# Patient Record
Sex: Female | Born: 1985 | Race: Black or African American | Hispanic: No | Marital: Married | Smoking: Never smoker
Health system: Southern US, Community
[De-identification: ages and names within clinical notes are randomized; demographics above are authoritative.]

## PROBLEM LIST (undated history)

## (undated) ENCOUNTER — Inpatient Hospital Stay (HOSPITAL_COMMUNITY): Payer: Self-pay

## (undated) DIAGNOSIS — T4145XA Adverse effect of unspecified anesthetic, initial encounter: Secondary | ICD-10-CM

## (undated) DIAGNOSIS — T8859XA Other complications of anesthesia, initial encounter: Secondary | ICD-10-CM

---

## 2016-02-04 LAB — OB RESULTS CONSOLE GC/CHLAMYDIA
CHLAMYDIA, DNA PROBE: NEGATIVE
Gonorrhea: NEGATIVE

## 2016-02-04 LAB — OB RESULTS CONSOLE HIV ANTIBODY (ROUTINE TESTING): HIV: NONREACTIVE

## 2016-02-04 LAB — OB RESULTS CONSOLE RPR: RPR: NONREACTIVE

## 2016-02-04 LAB — OB RESULTS CONSOLE ABO/RH: RH TYPE: POSITIVE

## 2016-02-04 LAB — OB RESULTS CONSOLE ANTIBODY SCREEN: ANTIBODY SCREEN: NEGATIVE

## 2016-02-04 LAB — OB RESULTS CONSOLE HEPATITIS B SURFACE ANTIGEN: HEP B S AG: NEGATIVE

## 2016-02-04 LAB — OB RESULTS CONSOLE RUBELLA ANTIBODY, IGM: RUBELLA: IMMUNE

## 2016-03-21 ENCOUNTER — Encounter (HOSPITAL_COMMUNITY): Payer: Self-pay | Admitting: *Deleted

## 2016-03-21 ENCOUNTER — Inpatient Hospital Stay (HOSPITAL_COMMUNITY)
Admission: AD | Admit: 2016-03-21 | Discharge: 2016-03-22 | Disposition: A | Discharge status: Home / Self Care | Payer: Medicaid Other | Source: Ambulatory Visit | Attending: Obstetrics & Gynecology | Admitting: Obstetrics & Gynecology

## 2016-03-21 DIAGNOSIS — Z3A18 18 weeks gestation of pregnancy: Secondary | ICD-10-CM | POA: Diagnosis not present

## 2016-03-21 DIAGNOSIS — O26892 Other specified pregnancy related conditions, second trimester: Secondary | ICD-10-CM | POA: Diagnosis not present

## 2016-03-21 DIAGNOSIS — N949 Unspecified condition associated with female genital organs and menstrual cycle: Secondary | ICD-10-CM | POA: Diagnosis not present

## 2016-03-21 DIAGNOSIS — N898 Other specified noninflammatory disorders of vagina: Secondary | ICD-10-CM | POA: Insufficient documentation

## 2016-03-21 DIAGNOSIS — R102 Pelvic and perineal pain: Secondary | ICD-10-CM | POA: Insufficient documentation

## 2016-03-21 LAB — URINALYSIS, ROUTINE W REFLEX MICROSCOPIC
Bilirubin Urine: NEGATIVE
GLUCOSE, UA: NEGATIVE mg/dL
Hgb urine dipstick: NEGATIVE
KETONES UR: NEGATIVE mg/dL
LEUKOCYTES UA: NEGATIVE
Nitrite: NEGATIVE
PROTEIN: NEGATIVE mg/dL
Specific Gravity, Urine: 1.02 (ref 1.005–1.030)
pH: 6.5 (ref 5.0–8.0)

## 2016-03-21 LAB — WET PREP, GENITAL
CLUE CELLS WET PREP: NONE SEEN
Sperm: NONE SEEN
Trich, Wet Prep: NONE SEEN
YEAST WET PREP: NONE SEEN

## 2016-03-21 MED ORDER — CYCLOBENZAPRINE HCL 10 MG PO TABS
10.0000 mg | ORAL_TABLET | Freq: Once | ORAL | Status: AC
  Administered 2016-03-21: 10 mg via ORAL
  Filled 2016-03-21: qty 1

## 2016-03-21 MED ORDER — CYCLOBENZAPRINE HCL 10 MG PO TABS: 10.0000 mg | ORAL_TABLET | Freq: Two times a day (BID) | ORAL | Status: DC | PRN

## 2016-03-21 NOTE — Discharge Instructions (Signed)

## 2016-03-21 NOTE — MAU Provider Note (Signed)
History     CSN: [id number]  Arrival date and time: 03/21/16 2213   First Provider Initiated Contact with Patient 03/21/16 2256      Chief Complaint  Patient presents with  . Abdominal Pain   HPI Ms. Victoria Keith is a 30 y.o. G3P1010 at [redacted]w[redacted]d who presents to MAU today with complaint of LLQ abdominal pain since this morning. She has tried 1 regular Tylenol twice today without relief. She denies vaginal bleeding, but has noted a small amount of thin, white discharge. She rates her pain at 10/10 now. Pain is worse with walking and change or positions. She denies dysuria, frequency or urgency, but does note pain is somewhat worse after urination.   OB History    Gravida Para Term Preterm AB TAB SAB Ectopic Multiple Living   History reviewed. No pertinent past medical history.  History reviewed. No pertinent past surgical history.  History reviewed. No pertinent family history.  Social History  Substance Use Topics  . Smoking status: Never Smoker   . Smokeless tobacco: None  . Alcohol Use: No    Allergies: No Known Allergies  No prescriptions prior to admission    Review of Systems  Constitutional: Negative for fever and malaise/fatigue.  Gastrointestinal: Positive for abdominal pain. Negative for nausea, vomiting, diarrhea and constipation.  Genitourinary: Negative for dysuria, urgency and frequency.       Neg - vaginal bleeding + vaginal discharge   Physical Exam   Blood pressure 98/63, pulse 67, temperature 98 F (36.7 C), temperature source Oral, resp. rate 18, height  (1.651 m), weight 154 lb 8 oz (70.081 kg), last menstrual period 11/10/2015.  Physical Exam  Nursing note and vitals reviewed. Constitutional: She is oriented to person, place, and time. She appears well-developed and well-nourished. No distress.  HENT:  Head: Normocephalic and atraumatic.  Cardiovascular: Normal rate.   Respiratory: Effort normal.  GI: Soft.  Bowel sounds are normal. She exhibits no distension and no mass. There is tenderness (moderate tenderness to palpation of the LLQ). There is no rebound and no guarding.  Genitourinary: Uterus is enlarged. Uterus is not tender. Cervix exhibits no motion tenderness, no discharge and no friability. No bleeding in the vagina. Vaginal discharge (scant white discharge noted) found.  Neurological: She is alert and oriented to person, place, and time.  Skin: Skin is warm and dry. No erythema.  Psychiatric: She has a normal mood and affect.  Dilation: Closed Effacement (%): Thick Cervical Position: Posterior Exam by:: Magnus Sinning, PA-C   Results for orders placed or performed during the hospital encounter of 03/21/16 (from the past 24 hour(s))  Urinalysis, Routine w reflex microscopic (not at [facility])     Status: None   Collection Time: 03/21/16 10:30 PM  Result Value Ref Range   Color, Urine YELLOW YELLOW   APPearance CLEAR CLEAR   Specific Gravity, Urine 1.020 1.005 - 1.030   pH 6.5 5.0 - 8.0   Glucose, UA NEGATIVE NEGATIVE mg/dL   Hgb urine dipstick NEGATIVE NEGATIVE   Bilirubin Urine NEGATIVE NEGATIVE   Ketones, ur NEGATIVE NEGATIVE mg/dL   Protein, ur NEGATIVE NEGATIVE mg/dL   Nitrite NEGATIVE NEGATIVE   Leukocytes, UA NEGATIVE NEGATIVE  Wet prep, genital     Status: Abnormal   Collection Time: 03/21/16 11:07 PM  Result Value Ref Range   Yeast Wet Prep HPF POC NONE SEEN NONE SEEN  Trich, Wet Prep NONE SEEN NONE SEEN   Clue Cells Wet Prep HPF POC NONE SEEN NONE SEEN   WBC, Wet Prep HPF POC FEW (A) NONE SEEN   Sperm NONE SEEN     MAU Course  Procedures None  MDM FHR - 138 bpm with doppler UA and wet prep today 10 mg Flexeril given - patient reports no pain on recheck after ~ 20 minutes Assessment and Plan  A: SIUP at 1045w6d Round ligament pain  P: Discharge home Rx for Flexeril given to patient  Second trimester precautions discussed Discussed appropriate use of Tylenol PRN for  pain Discussed use of abdominal binder, warm bath/shower and moderation of activity for pain Patient advised to follow-up with [facility] as scheduled for routine prenatal care or sooner PRN Patient may return to MAU as needed or if her condition were to change or worsen   Marny LowensteinJulie N Antrell Tipler, PA-C  03/22/2016, 12:50 AM

## 2016-03-21 NOTE — MAU Note (Signed)
Patient presents at 2018 weeks gestation with c/o lower left abdominal pain since this morning. Fetus active this morning but not tonight. Denies bleeding but notes discharge.

## 2016-03-31 ENCOUNTER — Other Ambulatory Visit: Payer: Self-pay | Admitting: Obstetrics & Gynecology

## 2016-03-31 DIAGNOSIS — O351XX0 Maternal care for (suspected) chromosomal abnormality in fetus, not applicable or unspecified: Secondary | ICD-10-CM

## 2016-03-31 DIAGNOSIS — Z3689 Encounter for other specified antenatal screening: Secondary | ICD-10-CM

## 2016-03-31 DIAGNOSIS — Z3A22 22 weeks gestation of pregnancy: Secondary | ICD-10-CM

## 2016-03-31 DIAGNOSIS — O3513X0 Maternal care for (suspected) chromosomal abnormality in fetus, trisomy 21, not applicable or unspecified: Secondary | ICD-10-CM

## 2016-04-04 ENCOUNTER — Encounter (HOSPITAL_COMMUNITY): Payer: Self-pay | Admitting: Obstetrics & Gynecology

## 2016-04-11 ENCOUNTER — Ambulatory Visit (HOSPITAL_COMMUNITY)
Admission: RE | Admit: 2016-04-11 | Discharge: 2016-04-11 | Disposition: A | Discharge status: Home / Self Care | Payer: Medicaid Other | Source: Ambulatory Visit | Attending: Obstetrics & Gynecology | Admitting: Obstetrics & Gynecology

## 2016-04-11 ENCOUNTER — Other Ambulatory Visit: Payer: Self-pay | Admitting: Obstetrics & Gynecology

## 2016-04-11 ENCOUNTER — Encounter (HOSPITAL_COMMUNITY): Payer: Self-pay

## 2016-04-11 DIAGNOSIS — Z36 Encounter for antenatal screening of mother: Secondary | ICD-10-CM | POA: Insufficient documentation

## 2016-04-11 DIAGNOSIS — Z3A21 21 weeks gestation of pregnancy: Secondary | ICD-10-CM | POA: Diagnosis present

## 2016-04-11 DIAGNOSIS — O280 Abnormal hematological finding on antenatal screening of mother: Secondary | ICD-10-CM

## 2016-04-11 DIAGNOSIS — O3513X0 Maternal care for (suspected) chromosomal abnormality in fetus, trisomy 21, not applicable or unspecified: Secondary | ICD-10-CM

## 2016-04-11 DIAGNOSIS — O281 Abnormal biochemical finding on antenatal screening of mother: Secondary | ICD-10-CM

## 2016-04-11 DIAGNOSIS — O351XX0 Maternal care for (suspected) chromosomal abnormality in fetus, not applicable or unspecified: Secondary | ICD-10-CM

## 2016-04-11 DIAGNOSIS — Z3A22 22 weeks gestation of pregnancy: Secondary | ICD-10-CM

## 2016-04-11 DIAGNOSIS — O34219 Maternal care for unspecified type scar from previous cesarean delivery: Secondary | ICD-10-CM | POA: Insufficient documentation

## 2016-04-11 DIAGNOSIS — Z3689 Encounter for other specified antenatal screening: Secondary | ICD-10-CM

## 2016-04-11 NOTE — Progress Notes (Signed)
Genetic Counseling  High-Risk Gestation Note  Appointment Date:  04/11/2016 Referred By: Victoria Keith, Victoria A, MD Date of Birth:  January 29, 1986 Partner:  Victoria Keith   Pregnancy History: [id number]: G3P1011 Estimated Date of Delivery: 08/16/16 Estimated Gestational Age: 2847w6d Attending: Particia NearingMartha Decker, MD   Ms. Victoria Keith and her husband, Mr. Victoria Keith, were seen for genetic counseling because of an increased risk for fetal Down syndrome based on Quad screen through [facility].  In summary:  Reviewed results of Quad screening test  Increased risk for Down syndrome (1 in 269)  Elevated DIA (2.88 MoM): offer third trimester ultrasound to assess fetal growth  Discussed additional screening options  NIPS-declined today; considering but wanted more time to think over information  Ultrasound: performed today and within normal limits  Discussed diagnostic testing options  Amniocentesis: declined  Reviewed family history concerns  Discussed general population carrier screening options: patient declined (previous CF and Hemoglobinopathy screening [facility])  They were counseled regarding the screening result and the associated 1 in 269 risk for fetal Down syndrome.  We reviewed chromosomes, nondisjunction, and the common features and variable prognosis of Down syndrome.  In addition, we reviewed the screen adjusted reduction in risks for trisomy 18 (1 in >10,000) and open neural tube defects.  We also discussed other explanations for a screen positive result including: a gestational dating error, differences in maternal metabolism, and normal variation. We specifically discussed that the level of one of the proteins analyzed on the screen, DIA, was very high (2.88 MoM).  This has been associated with an increased risk for growth restriction or poor pregnancy outcome later in pregnancy; therefore, we would recommend a follow up ultrasound  for fetal growth in the third trimester.  We reviewed other available screening options including noninvasive prenatal screening (NIPS)/cell free DNA (cfDNA) screening, and detailed ultrasound.  They were counseled that screening tests are used to modify a patient's a priori risk for aneuploidy, typically based on age. This estimate provides a pregnancy specific risk assessment. We reviewed the benefits and limitations of each option. Specifically, we discussed the conditions for which each test screens, the detection rates, and false positive rates of each. They were also counseled regarding diagnostic testing via amniocentesis. We reviewed the approximate 1 in 300-500 risk for complications from amniocentesis, including spontaneous pregnancy loss. We discussed the possible results that the tests might provide including: positive, negative, unanticipated, and no result. Finally, they were counseled regarding the cost of each option and potential out of pocket expenses.   Detailed ultrasound was performed today. Visualized fetal anatomy was within normal limits. Complete ultrasound results reported under separate cover. We reviewed the benefits and limitations of ultrasound as a screening tool for fetal aneuploidy. After consideration of all the options, they declined amniocentesis given the associated risk for complications. They also declined NIPS today. Ms. Victoria Keith indicated that she may consider pursuing NIPS but wanted additional time to process the Quad screen results and further consider NIPS as a screening option.   They understand that screening tests cannot rule out all birth defects or genetic syndromes. The patient was advised of this limitation and states she still does not want additional testing at this time.   Ms. Victoria Keith was provided with written information regarding cystic fibrosis (CF), spinal muscular atrophy (SMA) and hemoglobinopathies including the carrier frequency,  availability of carrier screening and prenatal diagnosis if indicated.  In addition, we discussed that CF  and hemoglobinopathies are routinely screened for as part of the [place] newborn screening panel.  After further discussion, she declined screening for CF, SMA and hemoglobinopathies.   Both family histories were reviewed and found to be contributory for a distant maternal cousin with deafness and another distant maternal cousin with blindness, both related through the patient's maternal grandfather and both cousins to each other. She had limited information regarding these relatives but reported that each was otherwise healthy. We discussed that there can be various causes for congenital deafness and congenital blindness including teratogenic, multifactorial, sporadic, and genetic. Given the reported family history and degree of relation, recurrence risk for the current pregnancy is likely low. However, without further information regarding the provided family history, an accurate genetic risk cannot be calculated. Further genetic counseling is warranted if more information is obtained.  Ms. Victoria Keith denied exposure to environmental toxins or chemical agents. She denied the use of alcohol, tobacco or street drugs. She denied significant viral illnesses during the course of her pregnancy. Her medical and surgical histories were noncontributory.   I counseled this couple for approximately 40 minutes regarding the above risks and available options.   Victoria PlowmanKaren Shuntia Exton, MS,  Certified [employer] 04/11/2016

## 2016-04-21 ENCOUNTER — Other Ambulatory Visit: Payer: Self-pay | Admitting: Obstetrics and Gynecology

## 2016-04-21 ENCOUNTER — Other Ambulatory Visit (HOSPITAL_COMMUNITY): Payer: Self-pay | Admitting: Physician Assistant

## 2016-04-21 DIAGNOSIS — O289 Unspecified abnormal findings on antenatal screening of mother: Secondary | ICD-10-CM

## 2016-04-21 DIAGNOSIS — O34219 Maternal care for unspecified type scar from previous cesarean delivery: Secondary | ICD-10-CM

## 2016-04-21 DIAGNOSIS — Z3A29 29 weeks gestation of pregnancy: Secondary | ICD-10-CM

## 2016-04-27 ENCOUNTER — Other Ambulatory Visit (HOSPITAL_COMMUNITY): Payer: Self-pay

## 2016-04-29 ENCOUNTER — Encounter (HOSPITAL_COMMUNITY): Payer: Self-pay

## 2016-05-31 ENCOUNTER — Encounter (HOSPITAL_COMMUNITY): Payer: Self-pay

## 2016-05-31 ENCOUNTER — Other Ambulatory Visit (HOSPITAL_COMMUNITY): Payer: Self-pay | Admitting: Physician Assistant

## 2016-05-31 ENCOUNTER — Ambulatory Visit (HOSPITAL_COMMUNITY)
Admission: RE | Admit: 2016-05-31 | Discharge: 2016-05-31 | Disposition: A | Discharge status: Home / Self Care | Payer: Medicaid Other | Source: Ambulatory Visit | Attending: Obstetrics & Gynecology | Admitting: Obstetrics & Gynecology

## 2016-05-31 VITALS — BP 109/66 | HR 98 | Wt 173.2 lb

## 2016-05-31 DIAGNOSIS — Z3A29 29 weeks gestation of pregnancy: Secondary | ICD-10-CM

## 2016-05-31 DIAGNOSIS — O34219 Maternal care for unspecified type scar from previous cesarean delivery: Secondary | ICD-10-CM | POA: Diagnosis not present

## 2016-05-31 DIAGNOSIS — O281 Abnormal biochemical finding on antenatal screening of mother: Secondary | ICD-10-CM | POA: Diagnosis not present

## 2016-05-31 DIAGNOSIS — O280 Abnormal hematological finding on antenatal screening of mother: Secondary | ICD-10-CM

## 2016-05-31 DIAGNOSIS — O4400 Placenta previa specified as without hemorrhage, unspecified trimester: Secondary | ICD-10-CM

## 2016-05-31 DIAGNOSIS — O289 Unspecified abnormal findings on antenatal screening of mother: Secondary | ICD-10-CM

## 2016-07-19 ENCOUNTER — Encounter (HOSPITAL_COMMUNITY): Payer: Self-pay

## 2016-07-19 ENCOUNTER — Other Ambulatory Visit (HOSPITAL_COMMUNITY): Payer: Self-pay | Admitting: Maternal and Fetal Medicine

## 2016-07-19 ENCOUNTER — Ambulatory Visit (HOSPITAL_COMMUNITY)
Admission: RE | Admit: 2016-07-19 | Discharge: 2016-07-19 | Disposition: A | Discharge status: Home / Self Care | Payer: Medicaid Other | Source: Ambulatory Visit | Attending: Obstetrics & Gynecology | Admitting: Obstetrics & Gynecology

## 2016-07-19 DIAGNOSIS — Z362 Encounter for other antenatal screening follow-up: Secondary | ICD-10-CM

## 2016-07-19 DIAGNOSIS — Z3A36 36 weeks gestation of pregnancy: Secondary | ICD-10-CM

## 2016-07-19 DIAGNOSIS — O281 Abnormal biochemical finding on antenatal screening of mother: Secondary | ICD-10-CM

## 2016-07-19 DIAGNOSIS — O34219 Maternal care for unspecified type scar from previous cesarean delivery: Secondary | ICD-10-CM | POA: Insufficient documentation

## 2016-07-19 DIAGNOSIS — O4403 Placenta previa specified as without hemorrhage, third trimester: Secondary | ICD-10-CM | POA: Insufficient documentation

## 2016-07-19 DIAGNOSIS — O4400 Placenta previa specified as without hemorrhage, unspecified trimester: Secondary | ICD-10-CM

## 2016-07-21 ENCOUNTER — Inpatient Hospital Stay (HOSPITAL_COMMUNITY)
Admission: AD | Admit: 2016-07-21 | Discharge: 2016-07-21 | Discharge status: Against Medical Advice | Payer: Medicaid Other | Source: Ambulatory Visit | Attending: Family Medicine | Admitting: Family Medicine

## 2016-07-21 NOTE — MAU Note (Signed)
Pt states she has to leave, AMA form signed.

## 2016-08-01 ENCOUNTER — Encounter (HOSPITAL_COMMUNITY): Payer: Self-pay

## 2016-08-01 LAB — OB RESULTS CONSOLE GBS: GBS: NEGATIVE

## 2016-08-04 ENCOUNTER — Ambulatory Visit (INDEPENDENT_AMBULATORY_CARE_PROVIDER_SITE_OTHER): Payer: Self-pay | Admitting: Pediatrics

## 2016-08-04 DIAGNOSIS — Z3490 Encounter for supervision of normal pregnancy, unspecified, unspecified trimester: Secondary | ICD-10-CM

## 2016-08-04 DIAGNOSIS — Z7681 Expectant parent(s) prebirth pediatrician visit: Secondary | ICD-10-CM

## 2016-08-04 NOTE — Progress Notes (Signed)
Prenatal counseling for impending newborn done  

## 2016-08-05 ENCOUNTER — Telehealth: Payer: Self-pay | Admitting: Obstetrics and Gynecology

## 2016-08-08 ENCOUNTER — Encounter (HOSPITAL_COMMUNITY)
Admission: RE | Admit: 2016-08-08 | Discharge: 2016-08-08 | Disposition: A | Discharge status: Home / Self Care | Payer: Medicaid Other | Source: Ambulatory Visit | Attending: Obstetrics and Gynecology | Admitting: Obstetrics and Gynecology

## 2016-08-08 ENCOUNTER — Telehealth: Payer: Self-pay | Admitting: Obstetrics and Gynecology

## 2016-08-08 HISTORY — DX: Adverse effect of unspecified anesthetic, initial encounter: T41.45XA

## 2016-08-08 HISTORY — DX: Other complications of anesthesia, initial encounter: T88.59XA

## 2016-08-08 LAB — CBC
HEMATOCRIT: 35.1 % — AB (ref 36.0–46.0)
HEMOGLOBIN: 12 g/dL (ref 12.0–15.0)
MCH: 30.2 pg (ref 26.0–34.0)
MCHC: 34.2 g/dL (ref 30.0–36.0)
MCV: 88.2 fL (ref 78.0–100.0)
Platelets: 159 10*3/uL (ref 150–400)
RBC: 3.98 MIL/uL (ref 3.87–5.11)
RDW: 15.3 % (ref 11.5–15.5)
WBC: 8.1 10*3/uL (ref 4.0–10.5)

## 2016-08-08 LAB — TYPE AND SCREEN
ABO/RH(D): B POS
ANTIBODY SCREEN: NEGATIVE

## 2016-08-08 LAB — ABO/RH: ABO/RH(D): B POS

## 2016-08-08 NOTE — Patient Instructions (Signed)
20 Victoria CaddyRachel Keith  08/08/2016   Your procedure is scheduled on:  08/09/2016  Enter through the Main Entr[facility] of [facility] at 0800 AM.  Pick up the phone at the desk and dial [phone].   Call this number if you have problems the morning of surgery: [phone]   Remember:   Do not eat food:After Midnight.  Do not drink clear liquids: After Midnight.  Take these medicines the morning of surgery with A SIP OF WATER: none   Do not wear jewelry, make-up or nail polish.  Do not wear lotions, powders, or perfumes. Do not wear deodorant.  Do not shave 48 hours prior to surgery.  Do not bring valuables to the hospital.  [facility] is not   responsible for any belongings or valuables brought to the hospital.  Contacts, dentures or bridgework may not be worn into surgery.  Leave suitcase in the car. After surgery it may be brought to your room.  For patients admitted to the hospital, checkout time is 11:00 AM the day of              discharge.   Patients discharged the day of surgery will not be allowed to drive             home.  Name and phone number of your driver: na  Special Instructions:   N/A   Please read over the following fact sheets that you were given:   Surgical Site Infection Prevention

## 2016-08-08 NOTE — Telephone Encounter (Signed)
OB Telephone Note Patient called bc of staff message sent on 10/20 saying pt would like to talk before surgery. She just wanted to make sure it was known that she had a seizure after a double dose of anesthesia prior to her c-section [name]. She states she had regional with that. I told her that the anesthesia doctors and CRNAs involved in her care tomorrow will meet her and go over her history again and we will only proceed once everything is safe and I thanked her for making sure we knew.  CBC and ABO normal.   [place] Victoria Keith, [name] MD Attending Center for [facility](Faculty Practice)

## 2016-08-09 ENCOUNTER — Inpatient Hospital Stay (HOSPITAL_COMMUNITY): Payer: Medicaid Other | Admitting: Certified Registered Nurse Anesthetist

## 2016-08-09 ENCOUNTER — Encounter: Payer: Self-pay | Admitting: Obstetrics and Gynecology

## 2016-08-09 ENCOUNTER — Inpatient Hospital Stay (HOSPITAL_COMMUNITY)
Admission: RE | Admit: 2016-08-09 | Discharge: 2016-08-11 | DRG: 766 | Disposition: A | Discharge status: Home / Self Care | Payer: Medicaid Other | Source: Ambulatory Visit | Attending: Obstetrics and Gynecology | Admitting: Obstetrics and Gynecology

## 2016-08-09 ENCOUNTER — Encounter (HOSPITAL_COMMUNITY)
Admission: RE | Disposition: A | Discharge status: Home / Self Care | Payer: Self-pay | Source: Ambulatory Visit | Attending: Obstetrics and Gynecology

## 2016-08-09 DIAGNOSIS — Z3A39 39 weeks gestation of pregnancy: Secondary | ICD-10-CM | POA: Diagnosis not present

## 2016-08-09 DIAGNOSIS — Z98891 History of uterine scar from previous surgery: Secondary | ICD-10-CM | POA: Insufficient documentation

## 2016-08-09 DIAGNOSIS — O34211 Maternal care for low transverse scar from previous cesarean delivery: Principal | ICD-10-CM | POA: Diagnosis present

## 2016-08-09 LAB — RPR: RPR Ser Ql: NONREACTIVE

## 2016-08-09 SURGERY — Surgical Case
Anesthesia: Spinal | Site: Abdomen

## 2016-08-09 MED ORDER — ACETAMINOPHEN 325 MG PO TABS
650.0000 mg | ORAL_TABLET | ORAL | Status: DC | PRN
  Administered 2016-08-10 – 2016-08-11 (×5): 650 mg via ORAL
  Filled 2016-08-09 (×5): qty 2

## 2016-08-09 MED ORDER — NALOXONE HCL 0.4 MG/ML IJ SOLN: 0.4000 mg | INTRAMUSCULAR | Status: DC | PRN

## 2016-08-09 MED ORDER — ZOLPIDEM TARTRATE 5 MG PO TABS: 5.0000 mg | ORAL_TABLET | Freq: Every evening | ORAL | Status: DC | PRN

## 2016-08-09 MED ORDER — OXYTOCIN 40 UNITS IN LACTATED RINGERS INFUSION - SIMPLE MED
2.5000 [IU]/h | INTRAVENOUS | Status: AC
  Administered 2016-08-09: 2.5 [IU]/h via INTRAVENOUS

## 2016-08-09 MED ORDER — HYDROMORPHONE HCL 1 MG/ML IJ SOLN
INTRAMUSCULAR | Status: AC
  Filled 2016-08-09: qty 1

## 2016-08-09 MED ORDER — LACTATED RINGERS IV SOLN
INTRAVENOUS | Status: DC
  Administered 2016-08-09 (×3): via INTRAVENOUS

## 2016-08-09 MED ORDER — OXYCODONE HCL 5 MG PO TABS
10.0000 mg | ORAL_TABLET | ORAL | Status: DC | PRN
  Administered 2016-08-10 (×3): 10 mg via ORAL
  Filled 2016-08-09 (×3): qty 2

## 2016-08-09 MED ORDER — MENTHOL 3 MG MT LOZG: 1.0000 | LOZENGE | OROMUCOSAL | Status: DC | PRN

## 2016-08-09 MED ORDER — SIMETHICONE 80 MG PO CHEW
80.0000 mg | CHEWABLE_TABLET | Freq: Three times a day (TID) | ORAL | Status: DC
  Administered 2016-08-09 – 2016-08-11 (×6): 80 mg via ORAL
  Filled 2016-08-09 (×6): qty 1

## 2016-08-09 MED ORDER — FENTANYL CITRATE (PF) 100 MCG/2ML IJ SOLN
INTRAMUSCULAR | Status: DC | PRN
Start: 2016-08-09 — End: 2016-08-09
  Administered 2016-08-09: 20 ug via INTRATHECAL

## 2016-08-09 MED ORDER — OXYTOCIN 10 UNIT/ML IJ SOLN
INTRAMUSCULAR | Status: AC
  Filled 2016-08-09: qty 4

## 2016-08-09 MED ORDER — NALBUPHINE HCL 10 MG/ML IJ SOLN: 5.0000 mg | Freq: Once | INTRAMUSCULAR | Status: AC | PRN

## 2016-08-09 MED ORDER — ONDANSETRON HCL 4 MG/2ML IJ SOLN
INTRAMUSCULAR | Status: DC | PRN
Start: 2016-08-09 — End: 2016-08-09
  Administered 2016-08-09: 4 mg via INTRAVENOUS

## 2016-08-09 MED ORDER — LACTATED RINGERS IV SOLN: INTRAVENOUS | Status: DC

## 2016-08-09 MED ORDER — DIBUCAINE 1 % RE OINT: 1.0000 "application " | TOPICAL_OINTMENT | RECTAL | Status: DC | PRN

## 2016-08-09 MED ORDER — MEPERIDINE HCL 25 MG/ML IJ SOLN: 6.2500 mg | INTRAMUSCULAR | Status: DC | PRN

## 2016-08-09 MED ORDER — NALBUPHINE HCL 10 MG/ML IJ SOLN
INTRAMUSCULAR | Status: AC
  Filled 2016-08-09: qty 1

## 2016-08-09 MED ORDER — PROMETHAZINE HCL 25 MG/ML IJ SOLN: 6.2500 mg | INTRAMUSCULAR | Status: DC | PRN

## 2016-08-09 MED ORDER — OXYTOCIN 10 UNIT/ML IJ SOLN
INTRAMUSCULAR | Status: DC | PRN
  Administered 2016-08-09: 40 [IU] via INTRAVENOUS

## 2016-08-09 MED ORDER — MEPERIDINE HCL 25 MG/ML IJ SOLN
6.2500 mg | INTRAMUSCULAR | Status: DC | PRN
  Administered 2016-08-09 (×2): 12.5 mg via INTRAVENOUS

## 2016-08-09 MED ORDER — SENNOSIDES-DOCUSATE SODIUM 8.6-50 MG PO TABS
2.0000 | ORAL_TABLET | ORAL | Status: DC
  Administered 2016-08-10 (×2): 2 via ORAL
  Filled 2016-08-09 (×4): qty 2

## 2016-08-09 MED ORDER — OXYCODONE HCL 5 MG PO TABS
5.0000 mg | ORAL_TABLET | ORAL | Status: DC | PRN
  Administered 2016-08-10 – 2016-08-11 (×4): 5 mg via ORAL
  Filled 2016-08-09 (×4): qty 1

## 2016-08-09 MED ORDER — SODIUM CHLORIDE 0.9 % IR SOLN
Status: DC | PRN
  Administered 2016-08-09: 1000 mL

## 2016-08-09 MED ORDER — DIPHENHYDRAMINE HCL 50 MG/ML IJ SOLN: 12.5000 mg | INTRAMUSCULAR | Status: DC | PRN

## 2016-08-09 MED ORDER — NALOXONE HCL 2 MG/2ML IJ SOSY
1.0000 ug/kg/h | PREFILLED_SYRINGE | INTRAMUSCULAR | Status: DC | PRN
  Filled 2016-08-09: qty 2

## 2016-08-09 MED ORDER — COCONUT OIL OIL
1.0000 "application " | TOPICAL_OIL | Status: DC | PRN
  Filled 2016-08-09: qty 120

## 2016-08-09 MED ORDER — NALBUPHINE HCL 10 MG/ML IJ SOLN: 5.0000 mg | INTRAMUSCULAR | Status: DC | PRN

## 2016-08-09 MED ORDER — SIMETHICONE 80 MG PO CHEW
80.0000 mg | CHEWABLE_TABLET | ORAL | Status: DC
  Administered 2016-08-10 (×2): 80 mg via ORAL
  Filled 2016-08-09 (×2): qty 1

## 2016-08-09 MED ORDER — SCOPOLAMINE 1 MG/3DAYS TD PT72
MEDICATED_PATCH | TRANSDERMAL | Status: AC
  Administered 2016-08-09: 1.5 mg via TRANSDERMAL
  Filled 2016-08-09: qty 1

## 2016-08-09 MED ORDER — SIMETHICONE 80 MG PO CHEW
80.0000 mg | CHEWABLE_TABLET | ORAL | Status: DC | PRN
Start: 2016-08-09 — End: 2016-08-11

## 2016-08-09 MED ORDER — DIPHENHYDRAMINE HCL 25 MG PO CAPS
25.0000 mg | ORAL_CAPSULE | ORAL | Status: DC | PRN
  Filled 2016-08-09: qty 1

## 2016-08-09 MED ORDER — PANTOPRAZOLE SODIUM 40 MG PO TBEC
40.0000 mg | DELAYED_RELEASE_TABLET | Freq: Every day | ORAL | Status: DC
  Administered 2016-08-10 – 2016-08-11 (×2): 40 mg via ORAL
  Filled 2016-08-09 (×2): qty 1

## 2016-08-09 MED ORDER — PHENYLEPHRINE 8 MG IN D5W 100 ML (0.08MG/ML) PREMIX OPTIME
INJECTION | INTRAVENOUS | Status: DC | PRN
  Administered 2016-08-09: 60 ug/min via INTRAVENOUS

## 2016-08-09 MED ORDER — WITCH HAZEL-GLYCERIN EX PADS: 1.0000 "application " | MEDICATED_PAD | CUTANEOUS | Status: DC | PRN

## 2016-08-09 MED ORDER — PHENYLEPHRINE 8 MG IN D5W 100 ML (0.08MG/ML) PREMIX OPTIME
INJECTION | INTRAVENOUS | Status: AC
  Filled 2016-08-09: qty 100

## 2016-08-09 MED ORDER — SODIUM CHLORIDE 0.9% FLUSH: 3.0000 mL | INTRAVENOUS | Status: DC | PRN

## 2016-08-09 MED ORDER — NALBUPHINE HCL 10 MG/ML IJ SOLN
5.0000 mg | Freq: Once | INTRAMUSCULAR | Status: AC | PRN
  Administered 2016-08-09: 5 mg via SUBCUTANEOUS

## 2016-08-09 MED ORDER — LACTATED RINGERS IV SOLN
INTRAVENOUS | Status: DC
  Administered 2016-08-09: 10:00:00 via INTRAVENOUS

## 2016-08-09 MED ORDER — DIPHENHYDRAMINE HCL 25 MG PO CAPS
25.0000 mg | ORAL_CAPSULE | Freq: Four times a day (QID) | ORAL | Status: DC | PRN
Start: 2016-08-09 — End: 2016-08-11
  Filled 2016-08-09: qty 1

## 2016-08-09 MED ORDER — SCOPOLAMINE 1 MG/3DAYS TD PT72: 1.0000 | MEDICATED_PATCH | Freq: Once | TRANSDERMAL | Status: DC

## 2016-08-09 MED ORDER — ONDANSETRON HCL 4 MG/2ML IJ SOLN
INTRAMUSCULAR | Status: AC
  Filled 2016-08-09: qty 2

## 2016-08-09 MED ORDER — FENTANYL CITRATE (PF) 100 MCG/2ML IJ SOLN
INTRAMUSCULAR | Status: AC
  Filled 2016-08-09: qty 2

## 2016-08-09 MED ORDER — MORPHINE SULFATE (PF) 0.5 MG/ML IJ SOLN
INTRAMUSCULAR | Status: DC | PRN
  Administered 2016-08-09: .2 mg via INTRATHECAL

## 2016-08-09 MED ORDER — MORPHINE SULFATE-NACL 0.5-0.9 MG/ML-% IV SOSY
PREFILLED_SYRINGE | INTRAVENOUS | Status: AC
  Filled 2016-08-09: qty 1

## 2016-08-09 MED ORDER — HYDROMORPHONE HCL 1 MG/ML IJ SOLN
0.2500 mg | INTRAMUSCULAR | Status: DC | PRN
  Administered 2016-08-09 (×4): 0.5 mg via INTRAVENOUS

## 2016-08-09 MED ORDER — MEPERIDINE HCL 25 MG/ML IJ SOLN
INTRAMUSCULAR | Status: AC
  Filled 2016-08-09: qty 1

## 2016-08-09 MED ORDER — BUPIVACAINE IN DEXTROSE 0.75-8.25 % IT SOLN
INTRATHECAL | Status: DC | PRN
  Administered 2016-08-09: 1.5 mL via INTRATHECAL

## 2016-08-09 MED ORDER — TETANUS-DIPHTH-ACELL PERTUSSIS 5-2.5-18.5 LF-MCG/0.5 IM SUSP: 0.5000 mL | Freq: Once | INTRAMUSCULAR | Status: DC

## 2016-08-09 MED ORDER — CEFAZOLIN SODIUM-DEXTROSE 2-3 GM-% IV SOLR
INTRAVENOUS | Status: DC | PRN
  Administered 2016-08-09: 2 g via INTRAVENOUS

## 2016-08-09 MED ORDER — SCOPOLAMINE 1 MG/3DAYS TD PT72
1.0000 | MEDICATED_PATCH | Freq: Once | TRANSDERMAL | Status: DC
Start: 2016-08-09 — End: 2016-08-11
  Administered 2016-08-09: 1.5 mg via TRANSDERMAL

## 2016-08-09 MED ORDER — PRENATAL MULTIVITAMIN CH
1.0000 | ORAL_TABLET | Freq: Every day | ORAL | Status: DC
  Administered 2016-08-10 – 2016-08-11 (×2): 1 via ORAL
  Filled 2016-08-09 (×2): qty 1

## 2016-08-09 MED ORDER — ONDANSETRON HCL 4 MG/2ML IJ SOLN: 4.0000 mg | Freq: Three times a day (TID) | INTRAMUSCULAR | Status: DC | PRN

## 2016-08-09 SURGICAL SUPPLY — 31 items
BENZOIN TINCTURE PRP APPL 2/3 (GAUZE/BANDAGES/DRESSINGS) ×3 IMPLANT
CANISTER SUCT 3000ML PPV (MISCELLANEOUS) ×3 IMPLANT
CHLORAPREP W/TINT 26ML (MISCELLANEOUS) ×3 IMPLANT
CLAMP CORD UMBIL (MISCELLANEOUS) ×3 IMPLANT
CLOSURE WOUND 1/2 X4 (GAUZE/BANDAGES/DRESSINGS) ×1
DRSG OPSITE POSTOP 4X10 (GAUZE/BANDAGES/DRESSINGS) ×3 IMPLANT
ELECT REM PT RETURN 9FT ADLT (ELECTROSURGICAL) ×3
ELECTRODE REM PT RTRN 9FT ADLT (ELECTROSURGICAL) ×1 IMPLANT
GLOVE BIOGEL PI IND STRL 7.0 (GLOVE) ×2 IMPLANT
GLOVE BIOGEL PI IND STRL 7.5 (GLOVE) ×1 IMPLANT
GLOVE BIOGEL PI INDICATOR 7.0 (GLOVE) ×4
GLOVE BIOGEL PI INDICATOR 7.5 (GLOVE) ×2
GLOVE SKINSENSE NS SZ7.0 (GLOVE) ×2
GLOVE SKINSENSE STRL SZ7.0 (GLOVE) ×1 IMPLANT
GOWN STRL REUS W/ TWL LRG LVL3 (GOWN DISPOSABLE) ×2 IMPLANT
GOWN STRL REUS W/ TWL XL LVL3 (GOWN DISPOSABLE) ×1 IMPLANT
GOWN STRL REUS W/TWL LRG LVL3 (GOWN DISPOSABLE) ×4
GOWN STRL REUS W/TWL XL LVL3 (GOWN DISPOSABLE) ×2
LIQUID BAND (GAUZE/BANDAGES/DRESSINGS) ×3 IMPLANT
NS IRRIG 1000ML POUR BTL (IV SOLUTION) ×3 IMPLANT
PACK C SECTION WH (CUSTOM PROCEDURE TRAY) ×3 IMPLANT
PAD OB MATERNITY 4.3X12.25 (PERSONAL CARE ITEMS) ×3 IMPLANT
PAD PREP 24X48 CUFFED NSTRL (MISCELLANEOUS) ×3 IMPLANT
STRIP CLOSURE SKIN 1/2X4 (GAUZE/BANDAGES/DRESSINGS) ×2 IMPLANT
SUT MON AB 4-0 PS1 27 (SUTURE) ×3 IMPLANT
SUT MON AB-0 CT1 36 (SUTURE) ×6 IMPLANT
SUT PLAIN 2 0 (SUTURE) ×2
SUT PLAIN ABS 2-0 CT1 27XMFL (SUTURE) ×1 IMPLANT
SUT VIC AB 0 CT1 36 (SUTURE) ×6 IMPLANT
SUT VIC AB 3-0 CT1 27 (SUTURE) ×2
SUT VIC AB 3-0 CT1 TAPERPNT 27 (SUTURE) ×1 IMPLANT

## 2016-08-09 NOTE — Lactation Note (Signed)
This note was copied from a baby's chart. Lactation Consultation Note  Patient Name: Victoria Keith CaddyRachel Afrifa-Takyi ZOXWR'UToday's Date: 08/09/2016 Reason for consult: Initial assessment Baby at 5 hr of life. Experienced bf mom reports baby is latching well "sometimes". She does not think baby opens her mouth wide enough. She denies breast or nipple pain at this time but the last feeding was "painful". She is worried that she does not have enough milk. Answered questions about getting a DEBP. Discussed baby behavior, feeding frequency, baby belly size, and nipple care. Mom was very sleepy, minimal education done. She stated she can manually express and has spoon in room. Given lactation handouts. Aware of OP services and support group. [facility]. She will call for help at the next feeding if baby's latch does not improve.    Maternal Data Has patient been taught Hand Expression?: Yes Does the patient have breastfeeding experience prior to this delivery?: Yes  Feeding Feeding Type: Breast Fed Length of feed: 15 min  LATCH Score/Interventions Latch: Grasps breast easily, tongue down, lips flanged, rhythmical sucking.  Audible Swallowing: A few with stimulation Intervention(s): Skin to skin  Type of Nipple: Everted at rest and after stimulation  Comfort (Breast/Nipple): Soft / non-tender     Hold (Positioning): Full assist, staff holds infant at breast Intervention(s): Support Pillows;Skin to skin  LATCH Score: 7  Lactation Tools Discussed/Used WIC Program: Yes   Consult Status Consult Status: Follow-up Date: 08/10/16 Follow-up type: In-patient    Rulon Eisenmengerlizabeth E Jermey Closs 08/09/2016, 3:43 PM

## 2016-08-09 NOTE — Anesthesia Preprocedure Evaluation (Signed)
Anesthesia Evaluation  Patient identified by MRN, date of birth, ID band Patient awake    Reviewed: Allergy & Precautions, H&P , NPO status , Patient's Chart, lab work & pertinent test results  Airway Mallampati: I  TM Distance: >3 FB Neck ROM: full    Dental no notable dental hx.    Pulmonary neg pulmonary ROS,    Pulmonary exam normal        Cardiovascular negative cardio ROS Normal cardiovascular exam     Neuro/Psych negative neurological ROS  negative psych ROS   GI/Hepatic negative GI ROS, Neg liver ROS,   Endo/Other  negative endocrine ROS  Renal/GU negative Renal ROS     Musculoskeletal   Abdominal Normal abdominal exam  (+)   Peds  Hematology negative hematology ROS (+)   Anesthesia Other Findings   Reproductive/Obstetrics (+) Pregnancy                             Anesthesia Physical Anesthesia Plan  ASA: II  Anesthesia Plan: Spinal   Post-op Pain Management:    Induction:   Airway Management Planned:   Additional Equipment:   Intra-op Plan:   Post-operative Plan:   Informed Consent: I have reviewed the patients History and Physical, chart, labs and discussed the procedure including the risks, benefits and alternatives for the proposed anesthesia with the patient or authorized representative who has indicated his/her understanding and acceptance.     Plan Discussed with: CRNA and Surgeon  Anesthesia Plan Comments:         Anesthesia Quick Evaluation  

## 2016-08-09 NOTE — Anesthesia Postprocedure Evaluation (Signed)
Anesthesia Post Note  Patient: Victoria CaddyRachel Afrifa-Takyi  Procedure(s) Performed: Procedure(s) (LRB): REPEAT CESAREAN SECTION (N/A)  Patient location during evaluation: PACU Anesthesia Type: Spinal Level of consciousness: awake Pain management: pain level controlled Vital Signs Assessment: post-procedure vital signs reviewed and stable Respiratory status: spontaneous breathing Cardiovascular status: stable Postop Assessment: no headache, no backache, spinal receding, patient able to bend at knees and no signs of nausea or vomiting Anesthetic complications: no     Last Vitals:  Vitals:   08/09/16 0828  BP: 110/67  Pulse: 82  Resp: 16  Temp: 36.7 C    Last Pain:  Vitals:   08/09/16 0828  TempSrc: Oral   Pain Goal: Patients Stated Pain Goal: 3 (08/09/16 0828)               Aveline Daus JR,JOHN Susann GivensFRANKLIN

## 2016-08-09 NOTE — Anesthesia Postprocedure Evaluation (Signed)
Anesthesia Post Note  Patient: Victoria Keith  Procedure(s) Performed: Procedure(s) (LRB): REPEAT CESAREAN SECTION (N/A)  Patient location during evaluation: Mother Baby Anesthesia Type: Spinal Level of consciousness: awake and alert Pain management: pain level controlled Vital Signs Assessment: post-procedure vital signs reviewed and stable Respiratory status: spontaneous breathing Cardiovascular status: blood pressure returned to baseline Postop Assessment: no headache, no backache and spinal receding Anesthetic complications: no     Last Vitals:  Vitals:   08/09/16 1525 08/09/16 1631  BP: (!) 100/58 108/61  Pulse: 64 66  Resp: 20 20  Temp: 36.7 C 36.8 C    Last Pain:  Vitals:   08/09/16 1631  TempSrc: Oral  PainSc: 0-No pain   Pain Goal: Patients Stated Pain Goal: 3 (08/09/16 0828)               Salome ArntSterling, Janeann Paisley Marie

## 2016-08-09 NOTE — Transfer of Care (Signed)
Immediate Anesthesia Transfer of Care Note  Patient: Victoria Keith  Procedure(s) Performed: Procedure(s): REPEAT CESAREAN SECTION (N/A)  Patient Location: PACU  Anesthesia Type:Spinal  Level of Consciousness: awake, alert , oriented and patient cooperative  Airway & Oxygen Therapy: Patient Spontanous Breathing  Post-op Assessment: Report given to RN and Post -op Vital signs reviewed and stable  Post vital signs: Reviewed and stable  Last Vitals:  Vitals:   08/09/16 0828  BP: 110/67  Pulse: 82  Resp: 16  Temp: 36.7 C    Last Pain:  Vitals:   08/09/16 0828  TempSrc: Oral      Patients Stated Pain Goal: 3 (08/09/16 0828)  Complications: No apparent anesthesia complications

## 2016-08-09 NOTE — Op Note (Signed)
Operative Note   SURGERY DATE: 08/09/2016  PRE-OP DIAGNOSIS:  Intrauterine pregnancy @ 39 0/7 weeks Repeat cesarean section  Elevated DS risk/elevated Inhibin level  POST-OP DIAGNOSIS: Same   PROCEDURE: Repeat low transverse cesarean section via pfannenstiel skin incision with double layer uterine closure   SURGEON: Surgeon(s) and Role:    * [place] Bingharlie Pickens, MD - Primary    * Michaele OfferElizabeth Woodland Mumaw, DO - Assisting  ASSISTANT: Jen MowElizabeth Mumaw, DO  ANESTHESIA: spinal  ESTIMATED BLOOD LOSS: 800 cc  DRAINS: 300mL UOP via indwelling foley  TOTAL IV FLUIDS: 3400 mL crystalloid  VTE Prophylaxis: SCDs  ANTIBIOTICS: Two grams of Cefazolin were given., within 1 hour of skin incision  SPECIMENS: None  COMPLICATIONS: None  INDICATIONS: Repeat cesarean  FINDINGS: Minimal intra-abdominal adhesions were noted. Grossly normal uterus, tubes and ovaries. Clear amniotic fluid, cephalic female infant, weight 2775gm, APGARs 8/9, intact placenta.  PROCEDURE IN DETAIL: The patient was taken to the operating room where anesthesia was administered and normal fetal heart tones were confirmed. She was then prepped and draped in the normal fashion in the dorsal supine position with a leftward tilt.  After a time out was performed, a pfannensteil skin incision was made with the scalpel and carried through to the underlying layer of fascia. The fascia was then incised at the midline and this incision was extended laterally with the mayo scissors. Attention was turned to the superior aspect of the fascial incision which was grasped with the kocher clamps x 2, tented up and the rectus muscles were dissected off with the scalpel. In a similar fashion the inferior aspect of the fascial incision was grasped with the kocher clamps, tented up and the rectus muscles dissected off with the mayo scissors. The rectus muscles were then separated in the midline and the peritoneum was entered bluntly. The bladder blade  was inserted and the vesicouterine peritoneum was identified, tented up and entered with the metzenbaum scissors. This incision was extended laterally and the bladder flap was created digitally. The bladder blade was reinserted.  A low transverse hysterotomy was made with the scalpel until the endometrial cavity was breached and the amniotic sac ruptured with the Allis clamp, yielding clear amniotic fluid. This incision was extended bluntly and the infant's head, shoulders and body were delivered atraumatically.The cord was clamped x 2 and cut, and the infant was handed to the awaiting pediatricians, after delayed cord clamping was done for 60 seconds.  The placenta was then gradually expressed from the uterus and then the uterus was exteriorized and cleared of all clots and debris. The hysterotomy was repaired with a running suture of 1-0 Monocryl. A second imbricating layer of 1-0 Monocryl suture was then placed. Excellent hemostasis was noted.   The uterus and adnexa were then returned to the abdomen, and the hysterotomy and all operative sites were reinspected and excellent hemostasis was noted after irrigation and suction of the abdomen with warm saline.  The peritoneum was closed with a running stitch of 2-0 Vicryl. The fascia was reapproximated with 0 Vicryl in a simple running fashion. The subcutaneous layer was then reapproximated with interrupted sutures of 2-0 plain gut, and the skin was then closed with 4-0 monocryl, in a subcuticular fashion.  The patient  tolerated the procedure well. Sponge, lap, needle, and instrument counts were correct x 2. The patient was transferred to the recovery room awake, alert and breathing independently in stable condition.    Cleda ClarksElizabeth W Mumaw, DO OB Fellow Center for  Nurse, mental health Fish farm manager)

## 2016-08-09 NOTE — H&P (Signed)
Obstetrics Admission History & Physical  08/09/2016 - 9:12 AM Primary OBGYN: Other : [facility]  Chief Complaint: pre op for c-section  History of Present Illness  30 y.o. G3P1011 @ 216w0d (Dating: EDC 11/1, dated by early u/s), with the above CC. Pregnancy complicated by: h/o c-section x 1 and elevated DS risk and elevated inhibin level on quad screen and h/o anesthesia rxn.  Victoria Keith states that no labor or decreased FM s/s  Review of Systems:her 12 point review of systems is negative or as noted in the History of Present Illness.  PMHx:  Past Medical History:  Diagnosis Date  . Complication of anesthesia    says she had a seizure from a double dose of the anesthesia   PSHx:  Past Surgical History:  Procedure Laterality Date  . CESAREAN SECTION     Medications:  Prescriptions Prior to Admission  Medication Sig Dispense Refill Last Dose  . calcium elemental as carbonate (TUMS ULTRA 1000) 400 MG chewable tablet Chew 2,000 mg by mouth 3 (three) times daily as needed for heartburn.   Past Week at Unknown time  . Prenatal Vit-Fe Fumarate-FA (PRENATAL VITAMIN PO) Take 1 tablet by mouth daily.    Past Week at Unknown time  . cyclobenzaprine (FLEXERIL) 10 MG tablet Take 1 tablet (10 mg total) by mouth 2 (two) times daily as needed for muscle spasms. (Patient not taking: Reported on 07/19/2016) 20 tablet 0 Not Taking     Allergies: is allergic to diclofenac. OBHx:  OB History  Gravida Para Term Preterm AB Living  3 1 1   1 1   SAB TAB Ectopic Multiple Live Births  1            # Outcome Date GA Lbr Len/2nd Weight Sex Delivery Anes PTL Lv  3 Current           2 Term 2012     CS-LTranv     1 SAB 2011                         FHx: History reviewed. No pertinent family history. Soc Hx:  Social History   Social History  . Marital status: Married    Spouse name: N/A  . Number of children: N/A  . Years of education: N/A   Occupational History  . Not on file.    Social History Main Topics  . Smoking status: Never Smoker  . Smokeless tobacco: Never Used  . Alcohol use No  . Drug use: No  . Sexual activity: Yes   Other Topics Concern  . Not on file   Social History Narrative  . No narrative on file    Objective    Current Vital Signs 24h Vital Sign Ranges  T 98.1 F (36.7 C) Temp  Avg: 98.1 F (36.7 C)  Min: 98.1 F (36.7 C)  Max: 98.1 F (36.7 C)  BP 110/67 BP  Min: 110/67  Max: 110/67  HR 82 Pulse  Avg: 82  Min: 82  Max: 82  RR 16 Resp  Avg: 16  Min: 16  Max: 16  SaO2 100 % Not Delivered SpO2  Avg: 100 %  Min: 100 %  Max: 100 %       24 Hour I/O Current Shift I/O  Time Ins Outs No intake/output data recorded. No intake/output data recorded.    General: Well nourished, well developed female in no acute distress.  Skin:  Warm and  dry.  Cardiovascular: S1, S2 normal, no murmur, rub or gallop, regular rate and rhythm Respiratory:  Clear to auscultation bilateral. Normal respiratory effort Abdomen: gravid, nttp, well healed low transverse skin incision Neuro/Psych:  Normal mood and affect.     Recent Labs Lab 08/08/16 0940  WBC 8.1  HGB 12.0  HCT 35.1*  PLT 159   B POS  Radiology Posterior placenta  Perinatal info  B POS/ Rubella  Immune / Varicella Immune/RPR neg/HIV Negative/HepB Surf Ag Negative/TDaP:yes   Assessment & Plan   30 y.o. [id number] @ [redacted]w[redacted]d doing well Okay to proceed with surgery when okay with OR and anesthesia Pt does not want BTL (undecided about BC)  AVOID NSAIDS Needs PPI for PP orders  Victoria Copa MD Attending Center for [facility])

## 2016-08-09 NOTE — Anesthesia Procedure Notes (Signed)
Spinal  Patient location during procedure: OR Start time: 08/09/2016 9:27 AM End time: 08/09/2016 9:31 AM Staffing Anesthesiologist: Leilani AbleHATCHETT, Tallin Hart Performed: anesthesiologist  Preanesthetic Checklist Completed: patient identified, surgical consent, pre-op evaluation, timeout performed, IV checked, risks and benefits discussed and monitors and equipment checked Spinal Block Patient position: sitting Prep: site prepped and draped and DuraPrep Patient monitoring: heart rate, cardiac monitor, continuous pulse ox and blood pressure Approach: midline Location: L3-4 Injection technique: single-shot Needle Needle type: Sprotte  Needle gauge: 24 G Needle length: 9 cm Needle insertion depth: 6 cm Assessment Sensory level: T4

## 2016-08-09 NOTE — Progress Notes (Signed)
MD updated on status of abdominal dressing.  Change honeycomb and add a pressure dressing.

## 2016-08-10 LAB — CBC
HEMATOCRIT: 33.2 % — AB (ref 36.0–46.0)
HEMOGLOBIN: 11.5 g/dL — AB (ref 12.0–15.0)
MCH: 30.6 pg (ref 26.0–34.0)
MCHC: 34.6 g/dL (ref 30.0–36.0)
MCV: 88.3 fL (ref 78.0–100.0)
Platelets: 149 10*3/uL — ABNORMAL LOW (ref 150–400)
RBC: 3.76 MIL/uL — ABNORMAL LOW (ref 3.87–5.11)
RDW: 15.3 % (ref 11.5–15.5)
WBC: 11.3 10*3/uL — AB (ref 4.0–10.5)

## 2016-08-10 LAB — BIRTH TISSUE RECOVERY COLLECTION (PLACENTA DONATION)

## 2016-08-10 NOTE — Lactation Note (Signed)
This note was copied from a baby's chart. Lactation Consultation Note  Patient Name: Victoria Keith  ZOXWR'UToday's Date: 08/10/2016 Reason for consult: Follow-up assessment    [facility]  Mom expressed having sore nipples and no milk coming out so she has been giving some formula.  LC observed redness on right nipple and slight compression stripe left nipple.  [facility] hand expression and encouraged mom to leave EBM on nipple for healing.  [facility] (number was left on board), before next feed when baby latches.  LC encouraged STS if no feeding cues were noticed after a couple of hours.       Maternal Data Has patient been taught Hand Expression?: Yes  Feeding Feeding Type: Breast Fed Length of feed: 15 min  LATCH Score/Interventions                      Lactation Tools Discussed/Used     Consult Status Consult Status: Follow-up Date: 08/10/16 Follow-up type: In-patient    Maryruth HancockKelly Suzanne [facility] 08/10/2016, 11:55 AM

## 2016-08-10 NOTE — Lactation Note (Signed)
This note was copied from a baby's chart. Lactation Consultation Note  Patient Name: Girl Isac CaddyRachel Afrifa-Takyi ZOXWR'UToday's Date: 08/10/2016 Reason for consult: Follow-up assessment  [facility].  When LC entered room, mom had baby latched.  LC offered to help mom better position baby; non nutritive sucking observed.  Mom uncomfortable and experiencing cramping with feeding. Baby fell asleep at breast and LC offered mom a hand pump.  Mom educated on hand pump and shown how to clean parts.  Reviewed breastmilk storage guidelines as well.  Coconut oil was provided to lubricate the inside of flange for more comfort.  Baby woke and latched a second time with different positioning and better head support.  LC taught mom the difference between jaw/mouth movement with nutritive vs. Non nutritive suck.  Mom encouraged to call with further concerns or questions.    Maryruth HancockKelly Suzanne [facility] 08/10/2016, 1:15 PM

## 2016-08-10 NOTE — Progress Notes (Signed)
Subjective: Postpartum Day 1: Cesarean Delivery Drinking well. Hasn't tried solids yet. Foley out at 0400, hasn't gotten up to void yet. No flatus yet. Lochia and pain wnl.  Denies dizziness, lightheadedness, or sob. No complaints.    Objective: Vital signs in last 24 hours: Temp:  [97.9 F (36.6 C)-98.4 F (36.9 C)] 98 F (36.7 C) (10/25 0427) Pulse Rate:  [64-98] 69 (10/25 0507) Resp:  [16-20] 18 (10/25 0507) BP: (99-110)/(55-67) 105/59 (10/25 0507) SpO2:  [95 %-100 %] 97 % (10/25 0507)  Physical Exam:  General: alert, cooperative and no distress Lochia: appropriate Uterine Fundus: firm Incision: healing well, no significant drainage, big bulky dressing dry and intact DVT Evaluation: No evidence of DVT seen on physical exam. Negative Homan's sign. No cords or calf tenderness. No significant calf/ankle edema.   Recent Labs  08/08/16 0940 08/10/16 0506  HGB 12.0 11.5*  HCT 35.1* 33.2*    Assessment/Plan: Status post Cesarean section. Doing well postoperatively.  Continue current care. Call nurse for 1st time to void Ambulate today Try solids for breakfast Breast and bottlefeeding Condoms  Victoria Keith, Victoria Keith 08/10/2016, 7:18 AM

## 2016-08-10 NOTE — Progress Notes (Signed)
Pt got out of bed with RN assistance,  after a few minutes got dizzy and lightheaded, placed back to bed. VS- 101/55, HR 98, RR 16, O2 96% room air. Fundus firm at umbilicus with small bleeding. Lungs clear bilaterally, instructed to use incentove spirometer.  Pt states "let me rest".  Call bell within reach. Baby at bedside, will continue to monitor.

## 2016-08-11 MED ORDER — PHENAZOPYRIDINE HCL 200 MG PO TABS
200.0000 mg | ORAL_TABLET | Freq: Three times a day (TID) | ORAL | Status: DC
  Administered 2016-08-11 (×4): 200 mg via ORAL
  Filled 2016-08-11 (×6): qty 1

## 2016-08-11 MED ORDER — SENNOSIDES-DOCUSATE SODIUM 8.6-50 MG PO TABS: 2.0000 | ORAL_TABLET | ORAL | Status: DC

## 2016-08-11 MED ORDER — ACETAMINOPHEN 325 MG PO TABS: 650.0000 mg | ORAL_TABLET | ORAL | Status: DC | PRN

## 2016-08-11 MED ORDER — BISACODYL 10 MG RE SUPP
10.0000 mg | Freq: Once | RECTAL | Status: AC
  Administered 2016-08-11: 10 mg via RECTAL
  Filled 2016-08-11: qty 1

## 2016-08-11 MED ORDER — OXYCODONE HCL 5 MG PO TABS: 5.0000 mg | ORAL_TABLET | ORAL | 0 refills | Status: DC | PRN

## 2016-08-11 MED ORDER — POLYETHYLENE GLYCOL 3350 17 G PO PACK
17.0000 g | PACK | Freq: Once | ORAL | Status: AC
  Administered 2016-08-11: 17 g via ORAL
  Filled 2016-08-11: qty 1

## 2016-08-11 MED ORDER — POLYETHYLENE GLYCOL 3350 17 G PO PACK: 17.0000 g | PACK | Freq: Every day | ORAL | Status: DC

## 2016-08-11 NOTE — Progress Notes (Signed)
Pt c/o urethral pain. Pt denies burning or pain with urination but states that the pain is all of the rest of the time. Tylenol and oxycodone given at 2345 per request. Pt states she feels like she is emptying her bladder. Philipp DeputyKim Shaw CNM called and orders received (cath UA and bladder scan after void).

## 2016-08-11 NOTE — Progress Notes (Signed)
Discharge instructions reviewed with patient. Infant discharge instructions reviewed with mother and father of the baby. Questions answered. Prescriptions for pain medicine and stool softeners given to mom.

## 2016-08-11 NOTE — Discharge Instructions (Signed)

## 2016-08-11 NOTE — Plan of Care (Signed)
Problem: Pain Management: Goal: General experience of comfort will improve and pain level will decrease Outcome: Adequate for Discharge Encouraged mother to ambulate in hallway three times a day. Abdomen distended with increased pain. Provided mother with warm apple and prune juice mixed and encouraged increased ambulation.

## 2016-08-11 NOTE — Discharge Summary (Signed)
OB Discharge Summary     Patient Name: Victoria Keith DOB: [birth year] MRN: [id number]  Date of admission: 08/09/2016 Delivering MD: Walsh Bing   Date of discharge: 08/11/2016  Admitting diagnosis: cpt 59514 - REPEAT c-section x 2 Intrauterine pregnancy: [redacted]w[redacted]d     Secondary diagnosis:  Active Problems:   Status post repeat low transverse cesarean section  Additional problems: elevated DS risk/elevated inhibin level     Discharge diagnosis: Term Pregnancy Delivered                                                                                                Post partum procedures:none  Augmentation: N/A  Complications: None  Hospital course:  Sceduled C/S   30 y.o. yo [id number] at [redacted]w[redacted]d was admitted to the hospital 08/09/2016 for scheduled cesarean section with the following indication:Elective Repeat.  Membrane Rupture Time/Date: 10:04 AM ,08/09/2016   Patient delivered a Viable infant.08/09/2016  Details of operation can be found in separate operative note.  Pateint had an uncomplicated postpartum course.  She is ambulating, tolerating a regular diet, passing flatus, and urinating well. Patient is discharged home in stable condition on  08/11/16 provided that she passes flatus today. Will reeval this afternoon.          Physical exam Vitals:   08/10/16 0507 08/10/16 1030 08/10/16 1814 08/11/16 0544  BP: (!) 105/59 103/67 109/66 (!) 93/54  Pulse: 69 76 87 77  Resp: 18 18 18 18   Temp:  97.8 F (36.6 C) 99.3 F (37.4 C) 97.6 F (36.4 C)  TempSrc:  Oral Oral Axillary  SpO2: 97%     Weight:   78.9 kg (174 lb)   Height:   5\' 6"  (1.676 m)    General: alert and cooperative Lochia: appropriate Uterine Fundus: firm Incision: honeycomb approx 50% saturated on left DVT Evaluation: No evidence of DVT seen on physical exam. Labs: Lab Results  Component Value Date   WBC 11.3 (H) 08/10/2016   HGB 11.5 (L) 08/10/2016   HCT 33.2 (L) 08/10/2016   MCV 88.3  08/10/2016   PLT 149 (L) 08/10/2016   No flowsheet data found.  Discharge instruction: per After Visit Summary and "Baby and Me Booklet".  After visit meds:    Medication List    STOP taking these medications   cyclobenzaprine 10 MG tablet Commonly known as:  FLEXERIL   TUMS ULTRA 1000 400 MG chewable tablet Generic drug:  calcium elemental as carbonate     TAKE these medications   acetaminophen 325 MG tablet Commonly known as:  TYLENOL Take 2 tablets (650 mg total) by mouth every 4 (four) hours as needed (for pain scale < 4).   oxyCODONE 5 MG immediate release tablet Commonly known as:  Oxy IR/ROXICODONE Take 1 tablet (5 mg total) by mouth every 4 (four) hours as needed (pain scale 4-7).   PRENATAL VITAMIN PO Take 1 tablet by mouth daily.   senna-docusate 8.6-50 MG tablet Commonly known as:  Senokot-S Take 2 tablets by mouth daily. Start taking on:  08/12/2016       Diet: routine  diet  Activity: Advance as tolerated. Pelvic rest for 6 weeks.   Outpatient follow up:4 weeks Follow up Appt:No future appointments. Follow up Visit:No Follow-up on file.  Postpartum contraception: Condoms  Newborn Data: Live born female  Birth Weight: 6 lb 1.9 oz (2775 g) APGAR: 8, 9  Baby Feeding: Bottle and Breast Disposition:home with mother   08/11/2016 Cam HaiSHAW, KIMBERLY, CNM  9:24 AM

## 2016-08-11 NOTE — Progress Notes (Signed)
Pt reluctant to be catheterized for UA, stating "I just don't think I can handle that." Pt states urethral pain started upon removal of foley catheter. Pt states she would prefer to void in a specimen cup. Philipp DeputyKim Shaw CNM called. UA cancelled. Pt has been busy with baby and not up to void since 2300. Pt to call after next void for bladder scan.

## 2016-08-11 NOTE — Progress Notes (Signed)
Just had BM and now passing gas

## 2016-08-11 NOTE — Lactation Note (Signed)
This note was copied from a baby's chart. Lactation Consultation Note: Mother lying back in recliner cuddling infant. She states that breastfeeding is going well. She states she is pumping with hand pump and giving infant breastmilk. Mother denies having any questions .   Patient Name: Victoria Keith RUEAV'WToday's Date: 08/11/2016 Reason for consult: Follow-up assessment   Maternal Data    Feeding    LATCH Score/Interventions Latch:  (encouraged mother once again to call for latch)                    Lactation Tools Discussed/Used     Consult Status Consult Status: Follow-up Date: 08/12/16 Follow-up type: In-patient    Stevan BornKendrick, Deniel Mcquiston [facility] 08/11/2016, 3:31 PM

## 2016-08-11 NOTE — Progress Notes (Signed)
Patient states that she has only passed a small amount of gas only one time so far and is having severe right shoulder pain. Called Dr. Omer JackMumaw and requested miralax. MD ordered one dose of miralax now. Also encouraged increased ambulation, in which patient is beginning to do now. Gave patient a warm apple and prune juice cocktail this morning as well. Encouraged patient to call when she began to pass more flatus. Will continue to monitor. [name], Linda HedgesStefanie WadeHudspeth

## 2016-08-16 ENCOUNTER — Ambulatory Visit: Payer: Medicaid Other

## 2016-08-16 ENCOUNTER — Telehealth: Payer: Self-pay | Admitting: Obstetrics and Gynecology

## 2016-08-16 NOTE — Telephone Encounter (Signed)
Patient called to say she couldn't remember when she was suppose to take her dressing off. She stated she has a C-section on 08/09/2016. [facility], she told me to inform the patient she could remove the honeycomb today, but to leave the steri strips on. If she didn't feel comfortable in taking them off, she could come into the office. Patient stated she would take it off.

## 2016-09-14 ENCOUNTER — Ambulatory Visit (INDEPENDENT_AMBULATORY_CARE_PROVIDER_SITE_OTHER): Payer: Medicaid Other | Admitting: Advanced Practice Midwife

## 2016-09-14 ENCOUNTER — Encounter: Payer: Self-pay | Admitting: Advanced Practice Midwife

## 2016-09-14 VITALS — BP 104/72 | HR 73 | Ht 65.0 in | Wt 161.0 lb

## 2016-09-14 DIAGNOSIS — Z98891 History of uterine scar from previous surgery: Secondary | ICD-10-CM

## 2016-09-14 NOTE — Progress Notes (Signed)
Subjective:     Victoria Keith is a 30 y.o. female who presents for a postpartum visit. She is 5 weeks postpartum following a low cervical transverse Cesarean section. I have fully reviewed the prenatal and intrapartum course. The delivery was at 39 gestational weeks. Outcome: repeat cesarean section, low transverse incision. Anesthesia: spinal. Postpartum course has been unremarkable. Baby's course has been unremarkable. Baby is feeding by both breast and bottle - Similac Advance. Bleeding staining only. Bowel function is normal. Bladder function is normal. Patient is not sexually active. Contraception method is none. Patient plans to use condoms. Depression/Anxiety screening: negative.  The following portions of the patient's history were reviewed and updated as appropriate: allergies, current medications, past family history, past medical history, past social history, past surgical history and problem list.  Review of Systems Pertinent items are noted in HPI.   Objective:    There were no vitals taken for this visit.  General:  alert, cooperative and no distress   Breasts:  inspection negative, no nipple discharge or bleeding, no masses or nodularity palpable  Lungs: clear to auscultation bilaterally  Heart:  regular rate and rhythm, S1, S2 normal, no murmur, click, rub or gallop  Abdomen: soft, non-tender; bowel sounds normal; no masses,  no organomegaly and Incision well healed   Vulva:  not evaluated  Vagina: not evaluated  Cervix:  n/a  Corpus: not examined  Adnexa:  not evaluated  Rectal Exam: Not performed.        Assessment:     Normal postpartum exam. Pap smear not done at today's visit.   Plan:    1. Contraception: condoms 2. Discussed other options for contraception. Add spermacide for added effectiveness 3. Follow up in: 1 year or as needed.

## 2016-09-19 ENCOUNTER — Encounter: Payer: Self-pay | Admitting: Advanced Practice Midwife

## 2016-09-19 NOTE — Patient Instructions (Signed)

## 2017-06-15 IMAGING — US US MFM OB DETAIL+14 WK
1 series · 14 of 28 positions shown · non-contrast
Comparison: none

[Series 1: us mfm ob detail+14 wk · 93 acquisitions, 14 frames shown]
[im 4/93]
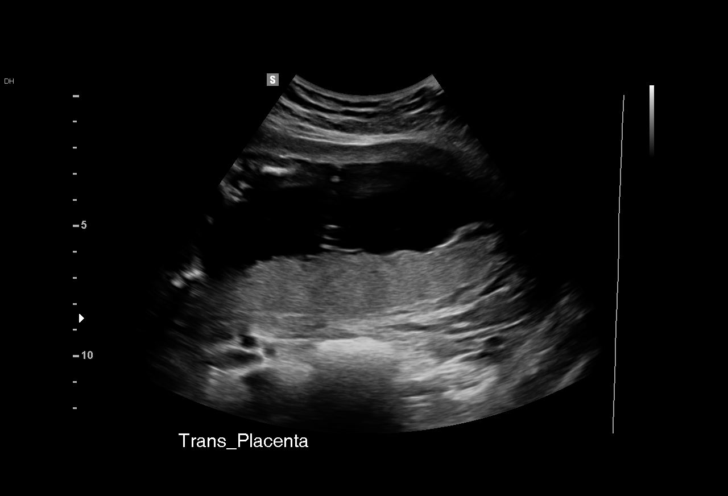
[im 11/93]
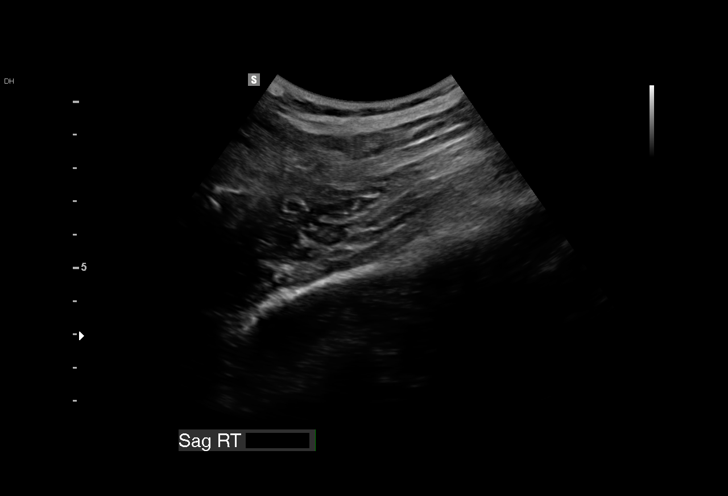
[im 18/93]
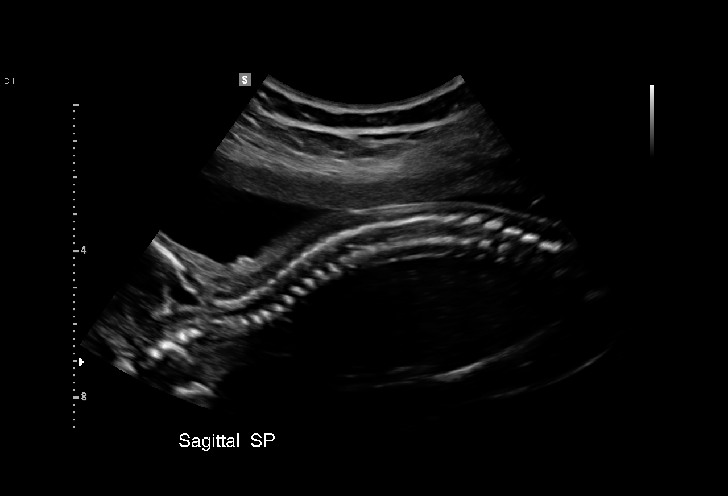
[im 24/93]
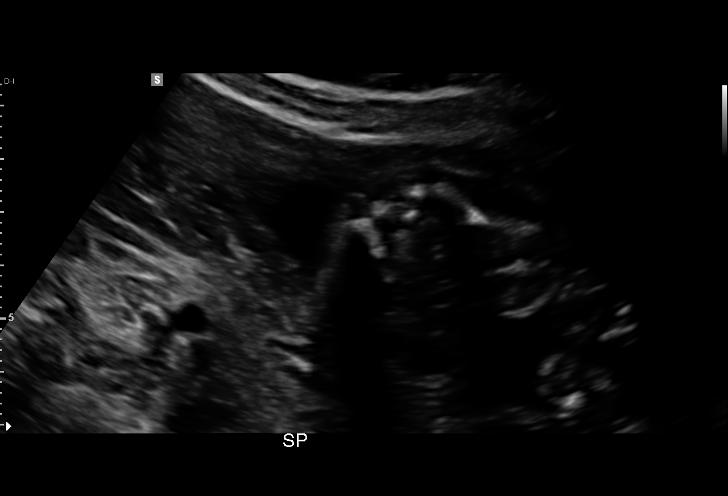
[im 31/93]
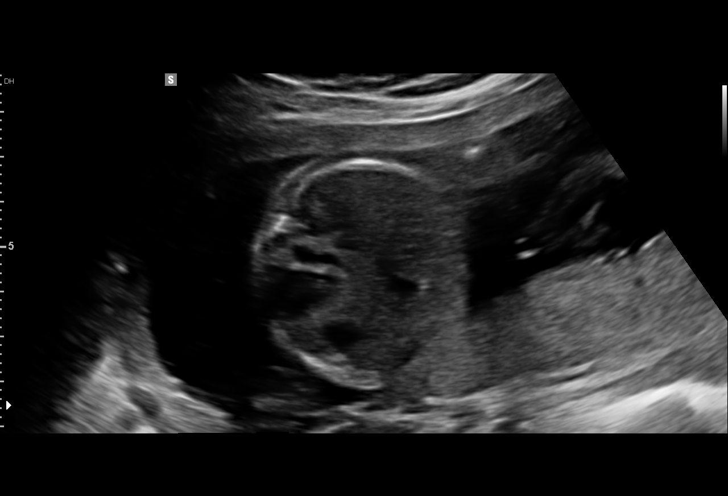
[im 38/93]
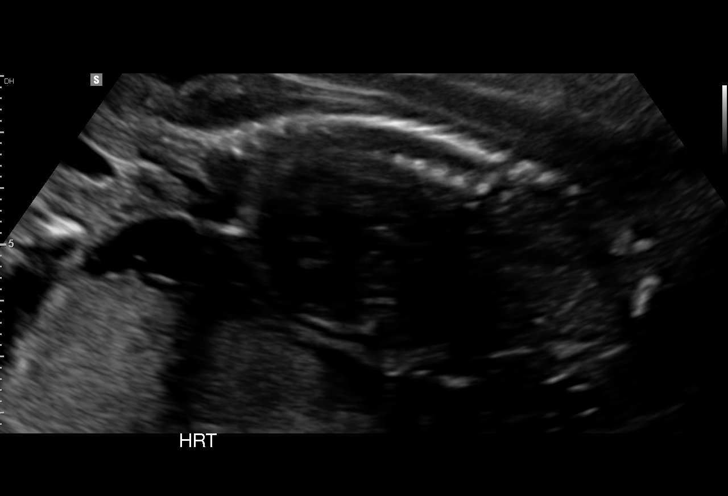
[im 45/93]
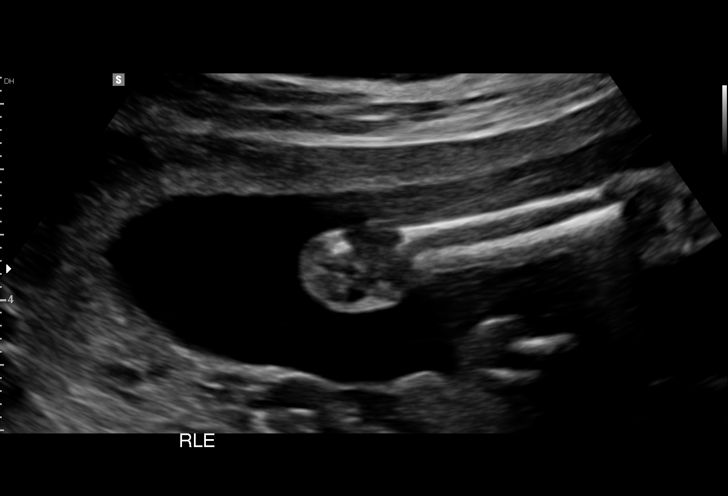
[im 52/93]
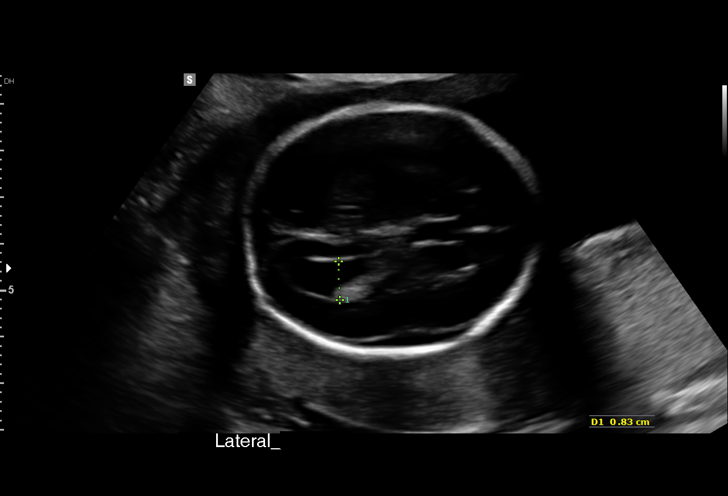
[im 58/93]
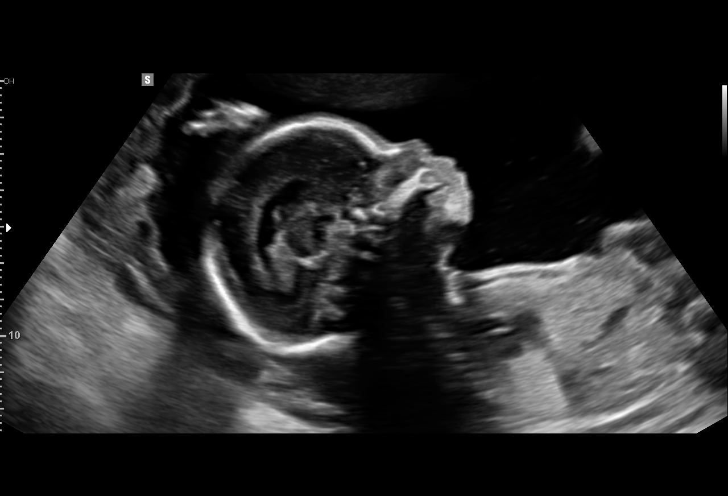
[im 65/93]
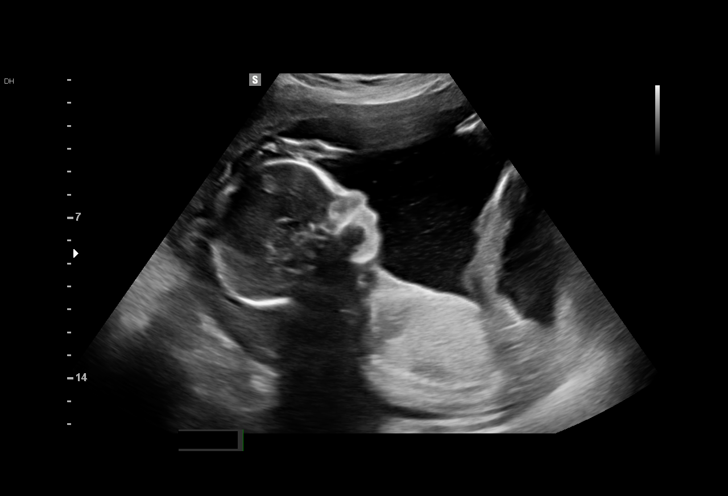
[im 72/93]
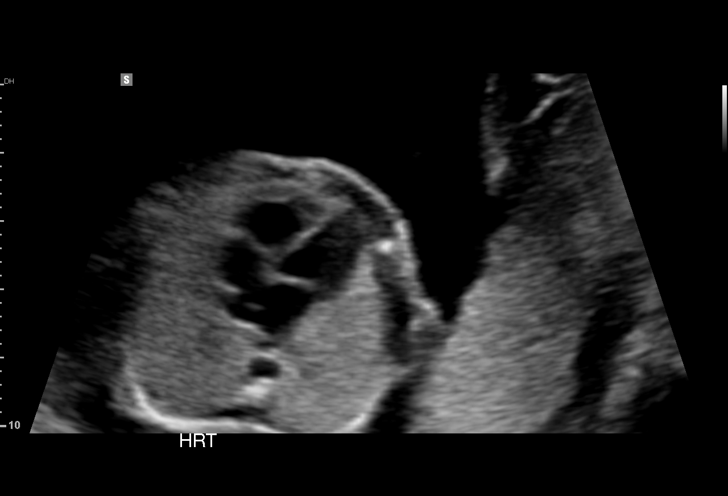
[im 79/93]
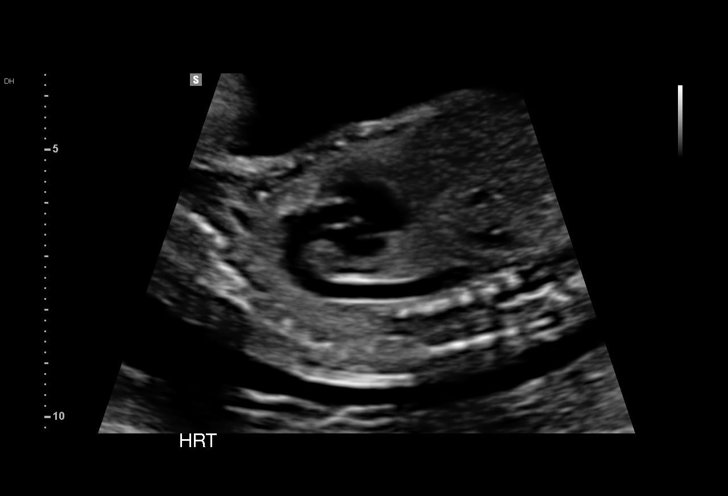
[im 86/93]
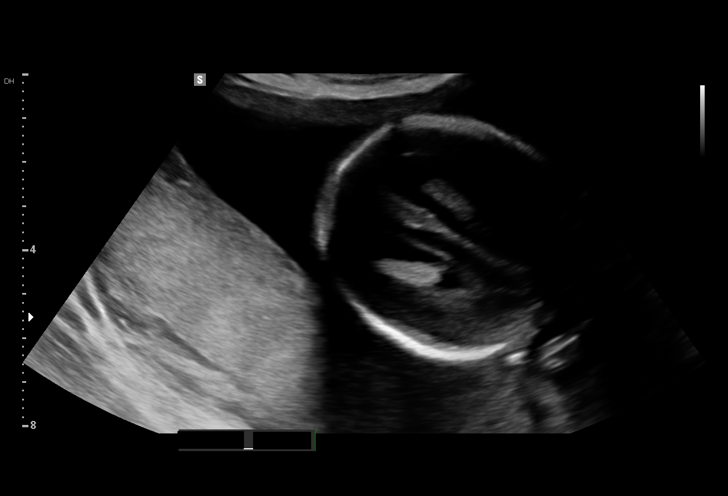
[im 93/93]
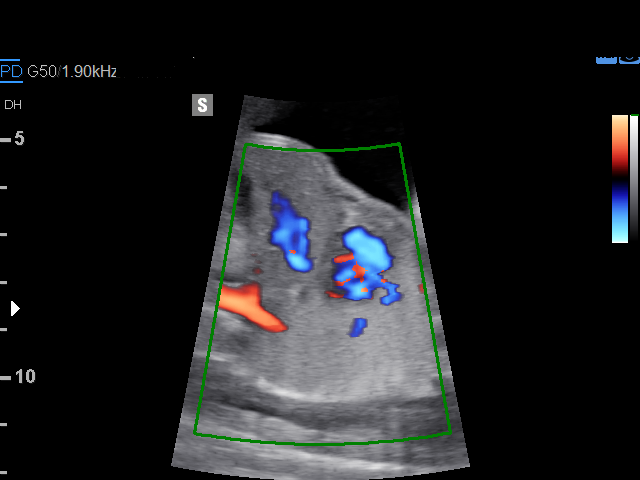

[14 of 28 positions shown; findings below may reference images not displayed]

[REDACTED]-
Faculty Physician

1  VINH TOSCANO           [id number]      [id number]     [id number]
Indications

21 weeks gestation of pregnancy
Detailed fetal anatomic survey                 Z36
Abnormal biochemical screen (quad) for
Trisomy 21 - DSR [DATE]
Abnormal biochemical finding on antenatal
screening of mother (Elevated Inhibin
MoM)
Previous cesarean delivery, antepartum
OB History

Gravidity:    3         Term:   1        Prem:   0         SAB:   1
TOP:          0       Ectopic:  0        Living: 1
Fetal Evaluation

Num Of Fetuses:     1
Fetal Heart         155
Rate(bpm):
Cardiac Activity:   Observed
Presentation:       Breech
Placenta:           Posterior, above cervical os
P. Cord Insertion:  Visualized, central

Amniotic Fluid
AFI FV:      Subjectively within normal limits

Largest Pocket(cm)
4.2
Biometry

BPD:        53  mm     G. Age:  22w 1d         57  %    CI:         76.07  %    70 - 86
FL/HC:       18.6  %    18.4 -
HC:      192.6  mm     G. Age:  21w 4d         25  %    HC/AC:       1.18       1.06 -
AC:        163  mm     G. Age:  21w 3d         27  %    FL/BPD:      67.5  %    71 - 87
FL:       35.8  mm     G. Age:  21w 2d         23  %    FL/AC:       22.0  %    20 - 24
HUM:        35  mm     G. Age:  22w 0d         53  %
CER:      23.8  mm     G. Age:  21w 6d         51  %

CM:        3.4  mm

Est. FW:     421   gm    0 lb 15 oz     33  %
Gestational Age

LMP:           21w 6d        Date:  11/10/15                 EDD:    08/16/16
U/S Today:     21w 4d                                        EDD:    08/18/16
Best:          21w 6d     Det. By:  LMP  (11/10/15)          EDD:    08/16/16
Anatomy

Cranium:               Appears normal         Aortic Arch:            Appears normal
Cavum:                 Appears normal         Ductal Arch:            Appears normal
Ventricles:            Appears normal         Diaphragm:              Appears normal
Choroid Plexus:        Appears normal         Stomach:                Appears normal, left
sided
Cerebellum:            Appears normal         Abdomen:                Appears normal
Posterior Fossa:       Appears normal         Abdominal Wall:         Appears nml (cord
insert, abd wall)
Nuchal Fold:           Not applicable (>20    Cord Vessels:           Appears normal (3
wks GA)                                        vessel cord)
Face:                  Appears normal         Kidneys:                Appear normal
(orbits and profile)
Lips:                  Appears normal         Bladder:                Appears normal
Thoracic:              Appears normal         Spine:                  Appears normal
Heart:                 Appears normal         Upper Extremities:      Appears normal
(4CH, axis, and
situs)
RVOT:                  Appears normal         Lower Extremities:      Appears normal
LVOT:                  Appears normal

Other:  Fetus appears to be a female. Heels and 5th digit visualized. Open
hands visualized. Nasal bone visualized.
Cervix Uterus Adnexa

Cervix
Length:            3.7  cm.
Normal appearance by transabdominal scan.

Left Ovary
Within normal limits.

Right Ovary
Not visualized.

Adnexa:       No abnormality visualized.
Impression

SIUP at 21+6 weeks
Normal detailed fetal anatomy
Markers of aneuploidy: none
Normal amniotic fluid volume
Measurements consistent with LMP dating
After genetic counseling, Ms. Jim declined further testing.
Recommendations

Follow-up ultrasound for growth in 6-8 weeks (elevated
inhibin)
Please see genetic counseling note

## 2017-08-04 IMAGING — US US MFM OB FOLLOW-UP
1 series · 14 of 28 positions shown · non-contrast
Comparison: none

[Series 1: us mfm ob follow-up · 40 acquisitions, 14 frames shown]
[im 2/40]
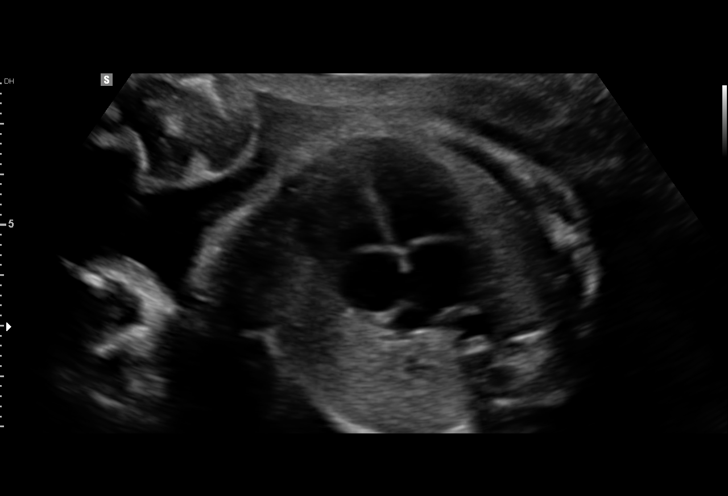
[im 5/40]
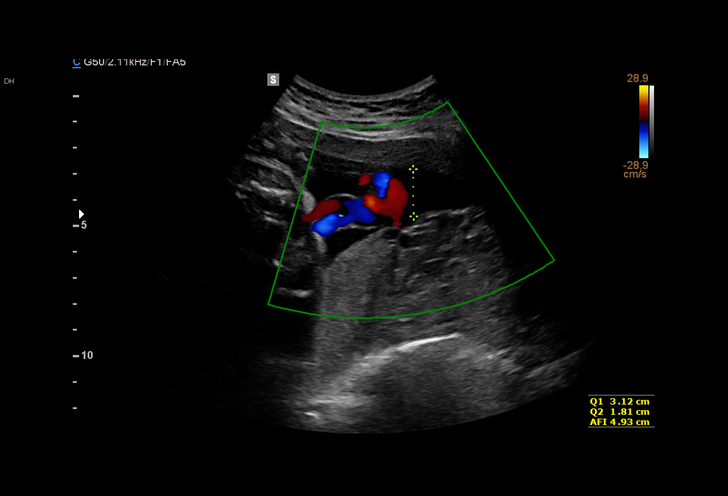
[im 8/40]
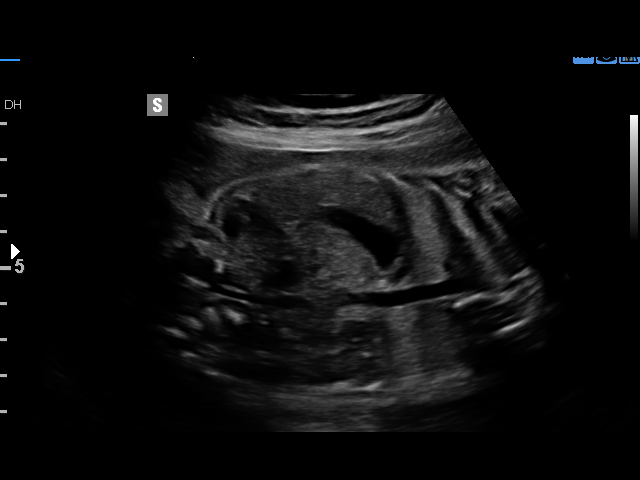
[im 11/40]
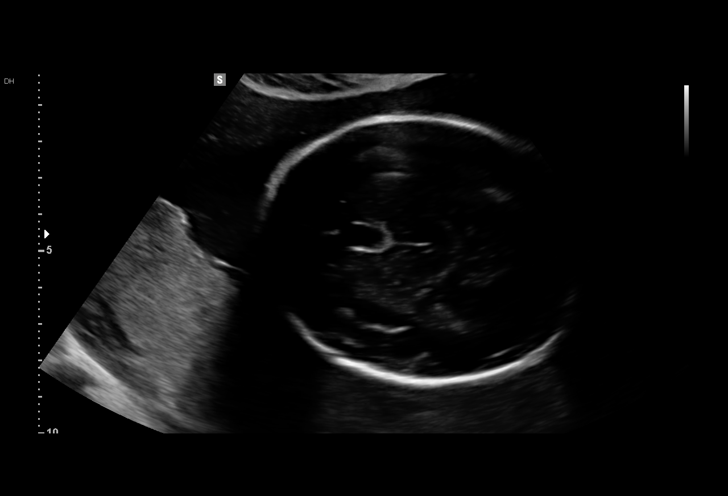
[im 14/40]
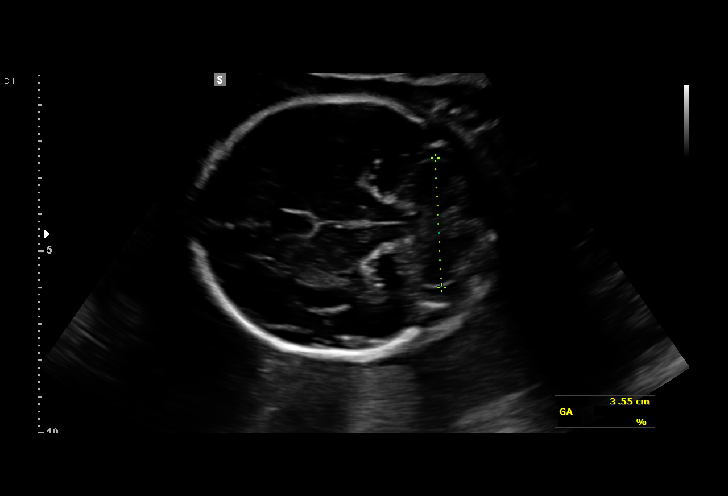
[im 16/40]
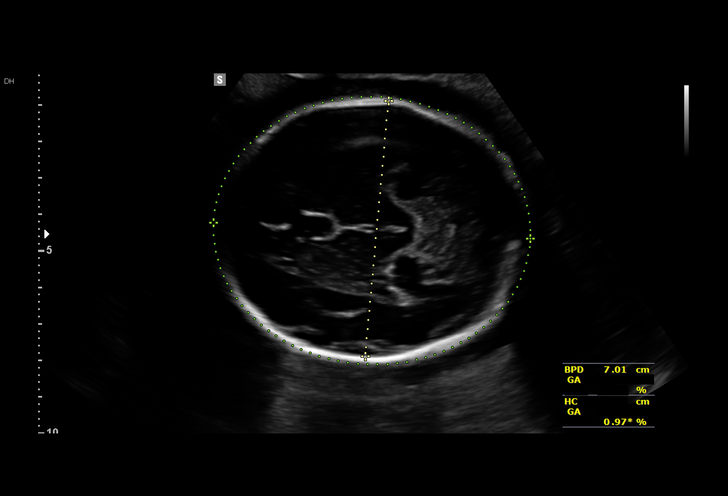
[im 19/40]
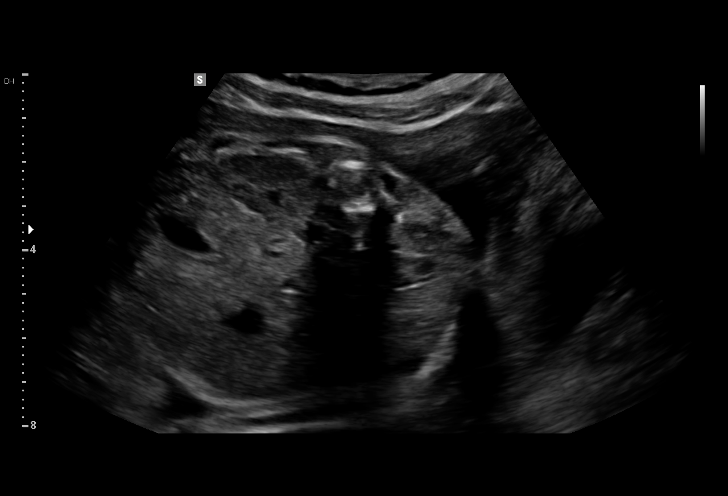
[im 22/40]
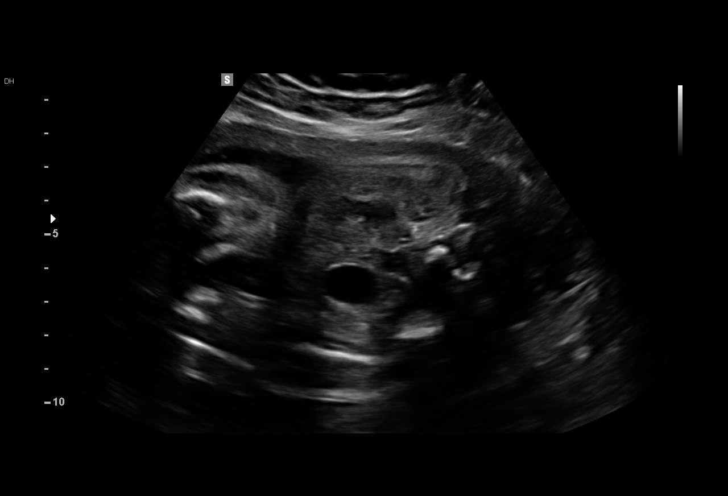
[im 25/40]
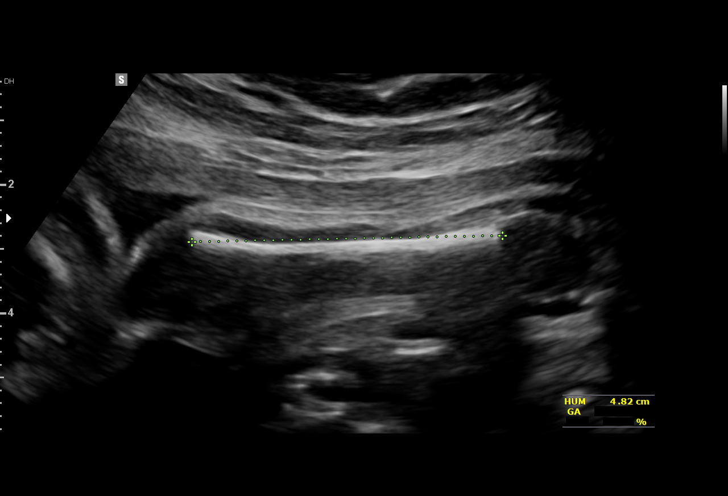
[im 28/40]
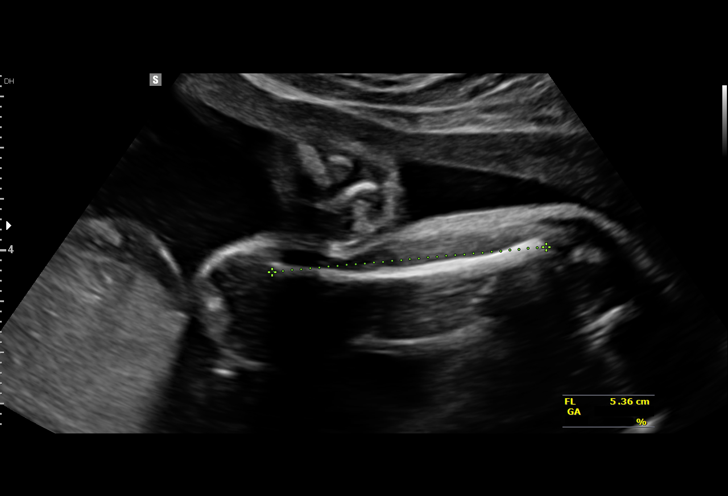
[im 31/40]
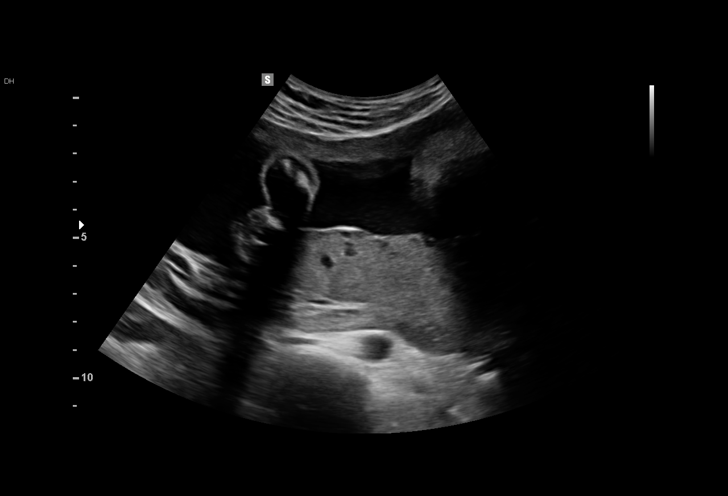
[im 34/40]
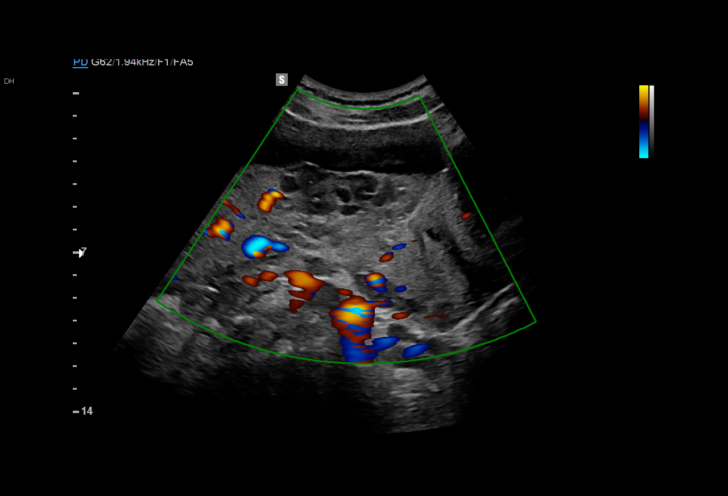
[im 37/40]
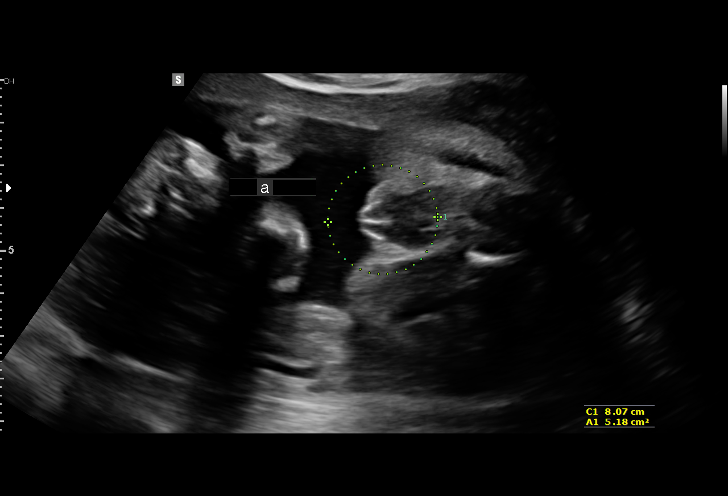
[im 40/40]
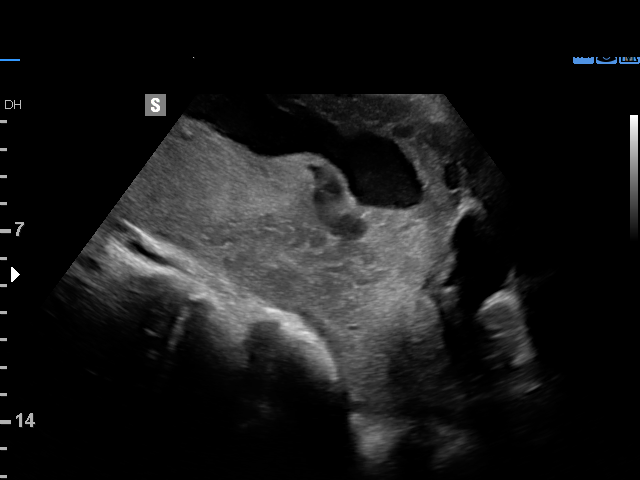

[14 of 28 positions shown; findings below may reference images not displayed]

[REDACTED]-
Faculty Physician

1  NYA JUMPER            [id number]      [id number]     [id number]
Indications

29 weeks gestation of pregnancy
Abnormal biochemical screen (quad) for
Trisomy 21 - DSR [DATE] (Ichtiarto/Gurjass Musolino, declined
further testing)
Abnormal biochemical finding on antenatal
screening of mother (Elevated Inhibin
MoM)
Previous cesarean delivery, antepartum
OB History

Gravidity:    3         Term:   1        Prem:   0        SAB:   1
TOP:          0       Ectopic:  0        Living: 1
Fetal Evaluation

Num Of Fetuses:     1
Fetal Heart         135
Rate(bpm):
Cardiac Activity:   Observed
Presentation:       Transverse, head to maternal left
Placenta:           Previa
P. Cord Insertion:  Previously Visualized

Amniotic Fluid
AFI FV:      Subjectively within normal limits

AFI Sum(cm)     %Tile       Largest Pocket(cm)
11.04           21

RUQ(cm)       RLQ(cm)       LUQ(cm)        LLQ(cm)
3.12
Biometry

BPD:      70.3  mm     G. Age:  28w 2d         17  %    CI:        75.12   %   70 - 86
FL/HC:      21.1   %   19.6 -
HC:      257.3  mm     G. Age:  28w 0d          4  %    HC/AC:      1.04       0.99 -
AC:      246.9  mm     G. Age:  28w 6d         41  %    FL/BPD:     77.4   %   71 - 87
FL:       54.4  mm     G. Age:  28w 5d         29  %    FL/AC:      22.0   %   20 - 24
HUM:      48.1  mm     G. Age:  28w 1d         29  %
CER:      35.5  mm     G. Age:  30w 4d         78  %

CM:        6.4  mm

Est. FW:    2675  gm    2 lb 13 oz      46  %
Gestational Age

LMP:           29w 0d       Date:   11/10/15                 EDD:   08/16/16
U/S Today:     28w 3d                                        EDD:   08/20/16
Best:          29w 0d    Det. By:   LMP  (11/10/15)          EDD:   08/16/16
Anatomy

Cranium:               Appears normal         Aortic Arch:            Previously seen
Cavum:                 Appears normal         Ductal Arch:            Previously seen
Ventricles:            Appears normal         Diaphragm:              Previously seen
Choroid Plexus:        Previously seen        Stomach:                Appears normal, left
sided
Cerebellum:            Appears normal         Abdomen:                Appears normal
Posterior Fossa:       Appears normal         Abdominal Wall:         Previously seen
Nuchal Fold:           Not applicable (>20    Cord Vessels:           Previously seen
wks GA)
Face:                  Orbits and profile     Kidneys:                Appear normal
previously seen
Lips:                  Previously seen        Bladder:                Appears normal
Thoracic:              Appears normal         Spine:                  Previously seen
Heart:                 Appears normal         Upper Extremities:      Previously seen
(4CH, axis, and
situs)
RVOT:                  Previously seen        Lower Extremities:      Previously seen
LVOT:                  Previously seen

Other:  Fetus appears to be a female. Heels and 5th digit previously
visualized. Nasal bone previously visualized.
Cervix Uterus Adnexa

Cervix
Not visualized (advanced GA >74wks)

Adnexa:       No abnormality visualized.
Impression

SIUP at 29+0 weeks
Normal interval anatomy; anatomic survey complete
Normal amniotic fluid volume
Appropriate interval growth with EFW at the 46th %tile
Inferior edge of posterior placenta appeared to cover most of
the cervix i.e previa (on previous US, only maternal vessel
seemed to be adjacent to cervix)

Precautions given.
Recommendations

Follow-up ultrasound in 
6-7 weeks to reassess placental
location

## 2019-09-20 ENCOUNTER — Other Ambulatory Visit: Payer: Self-pay

## 2019-09-20 DIAGNOSIS — Z20822 Contact with and (suspected) exposure to covid-19: Secondary | ICD-10-CM

## 2019-09-22 LAB — NOVEL CORONAVIRUS, NAA: SARS-CoV-2, NAA: NOT DETECTED

## 2020-01-30 ENCOUNTER — Ambulatory Visit: Payer: Self-pay | Attending: Family

## 2020-01-30 DIAGNOSIS — Z23 Encounter for immunization: Secondary | ICD-10-CM

## 2020-01-30 NOTE — Progress Notes (Signed)
   Covid-19 Vaccination Clinic  Name:  Victoria Keith    MRN: [id number] DOB: [birth year]  01/30/2020  [name] was observed post Covid-19 immunization for 15 minutes without incident. She was provided with Vaccine Information Sheet and instruction to access the V-Safe system.   [name] was instructed to call 911 with any severe reactions post vaccine: Marland Kitchen Difficulty breathing  . Swelling of face and throat  . A fast heartbeat  . A bad rash all over body  . Dizziness and weakness   Immunizations Administered    Name Date Dose VIS Date Route   Moderna COVID-19 Vaccine 01/30/2020 12:56 PM 0.5 mL 09/17/2019 Intramuscular   Manufacturer: Moderna   Lot: [id number]   NDC: 07218-288-33

## 2020-03-03 ENCOUNTER — Ambulatory Visit: Payer: Self-pay | Attending: Family

## 2020-03-03 DIAGNOSIS — Z23 Encounter for immunization: Secondary | ICD-10-CM

## 2020-03-03 NOTE — Progress Notes (Signed)
   Covid-19 Vaccination Clinic  Name:  Victoria Keith    MRN: [id number] DOB: [birth year]  03/03/2020  [name] was observed post Covid-19 immunization for 15 minutes without incident. She was provided with Vaccine Information Sheet and instruction to access the V-Safe system.   [name] was instructed to call 911 with any severe reactions post vaccine: Marland Kitchen Difficulty breathing  . Swelling of face and throat  . A fast heartbeat  . A bad rash all over body  . Dizziness and weakness   Immunizations Administered    Name Date Dose VIS Date Route   Moderna COVID-19 Vaccine 03/03/2020  1:25 PM 0.5 mL 09/2019 Intramuscular   Manufacturer: Moderna   Lot: [id number]   NDC: 43154-008-67

## 2020-03-24 ENCOUNTER — Other Ambulatory Visit: Payer: Self-pay

## 2020-03-24 ENCOUNTER — Other Ambulatory Visit: Payer: Self-pay | Admitting: Family Medicine

## 2020-03-24 ENCOUNTER — Ambulatory Visit: Payer: Self-pay

## 2020-03-24 DIAGNOSIS — M79672 Pain in left foot: Secondary | ICD-10-CM

## 2020-04-09 ENCOUNTER — Other Ambulatory Visit: Payer: Self-pay | Admitting: Family Medicine

## 2020-04-09 ENCOUNTER — Ambulatory Visit: Payer: Self-pay

## 2020-04-09 ENCOUNTER — Other Ambulatory Visit: Payer: Self-pay

## 2020-04-09 DIAGNOSIS — M79672 Pain in left foot: Secondary | ICD-10-CM

## 2021-06-13 IMAGING — DX DG FOOT COMPLETE 3+V*L*
3 series · 3 of 3 positions shown · non-contrast
Comparison: 03/24/2020

CLINICAL DATA: Persistent metatarsal pain LEFT foot

EXAM:
LEFT FOOT - COMPLETE 3+ VIEW

[foot ap]
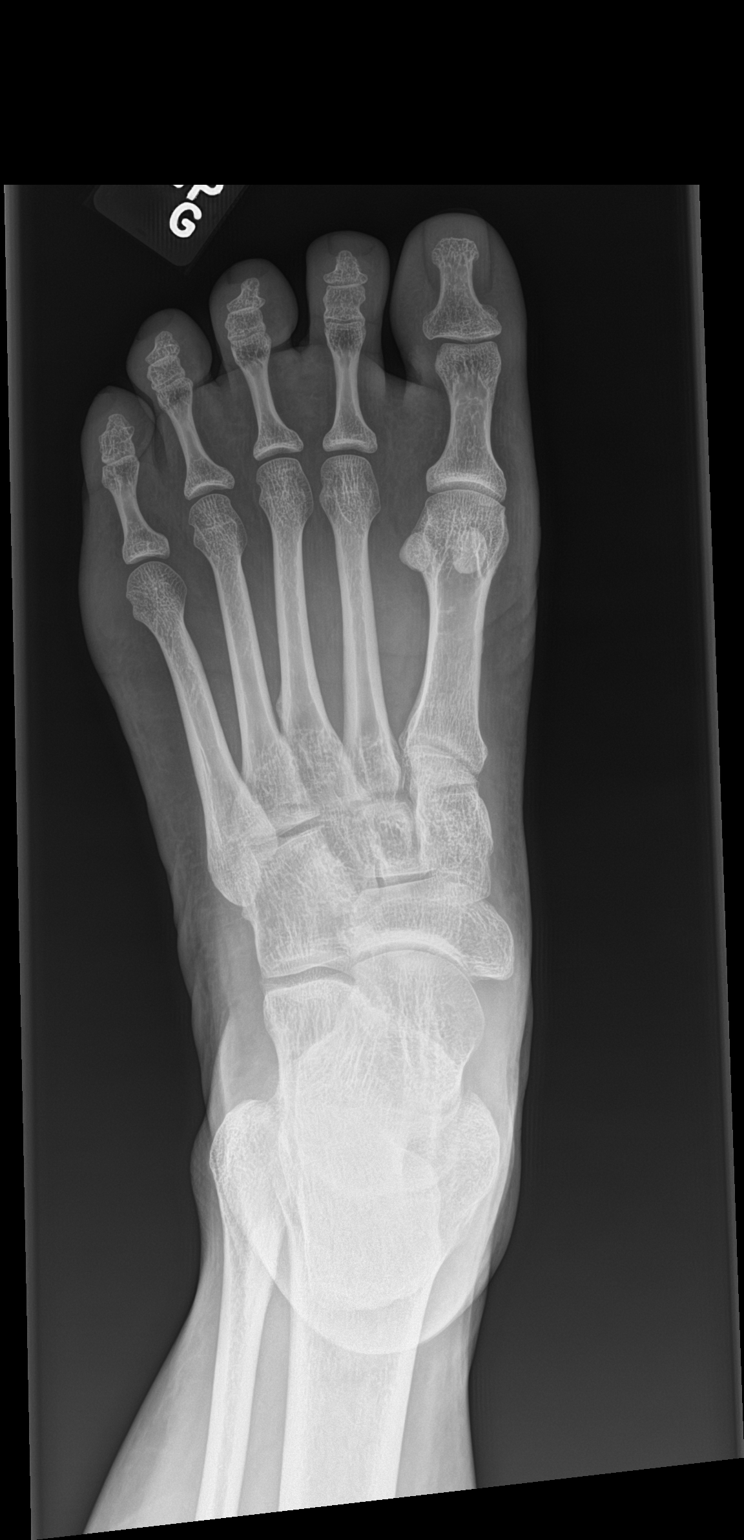

[foot obl]
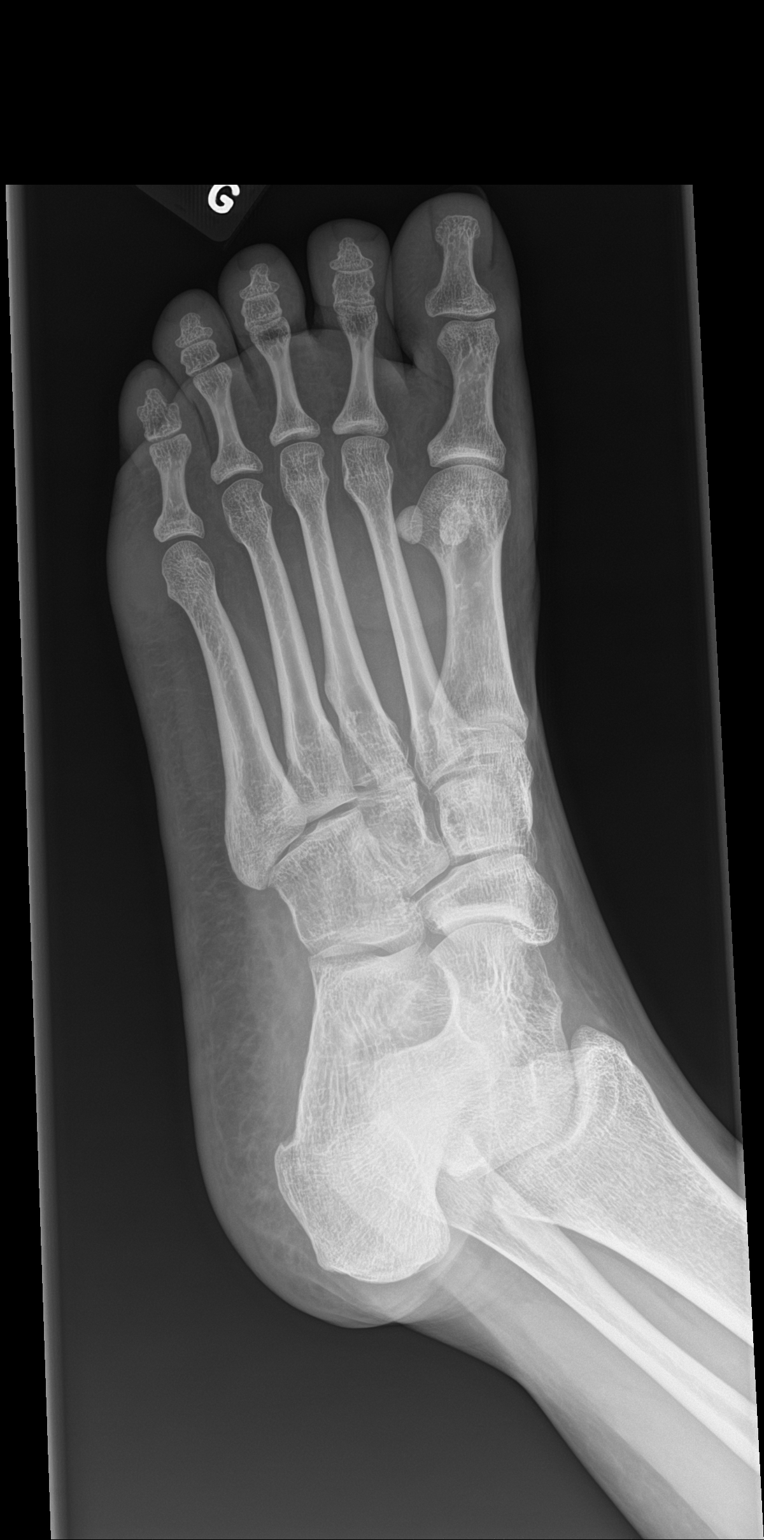

[foot lat]
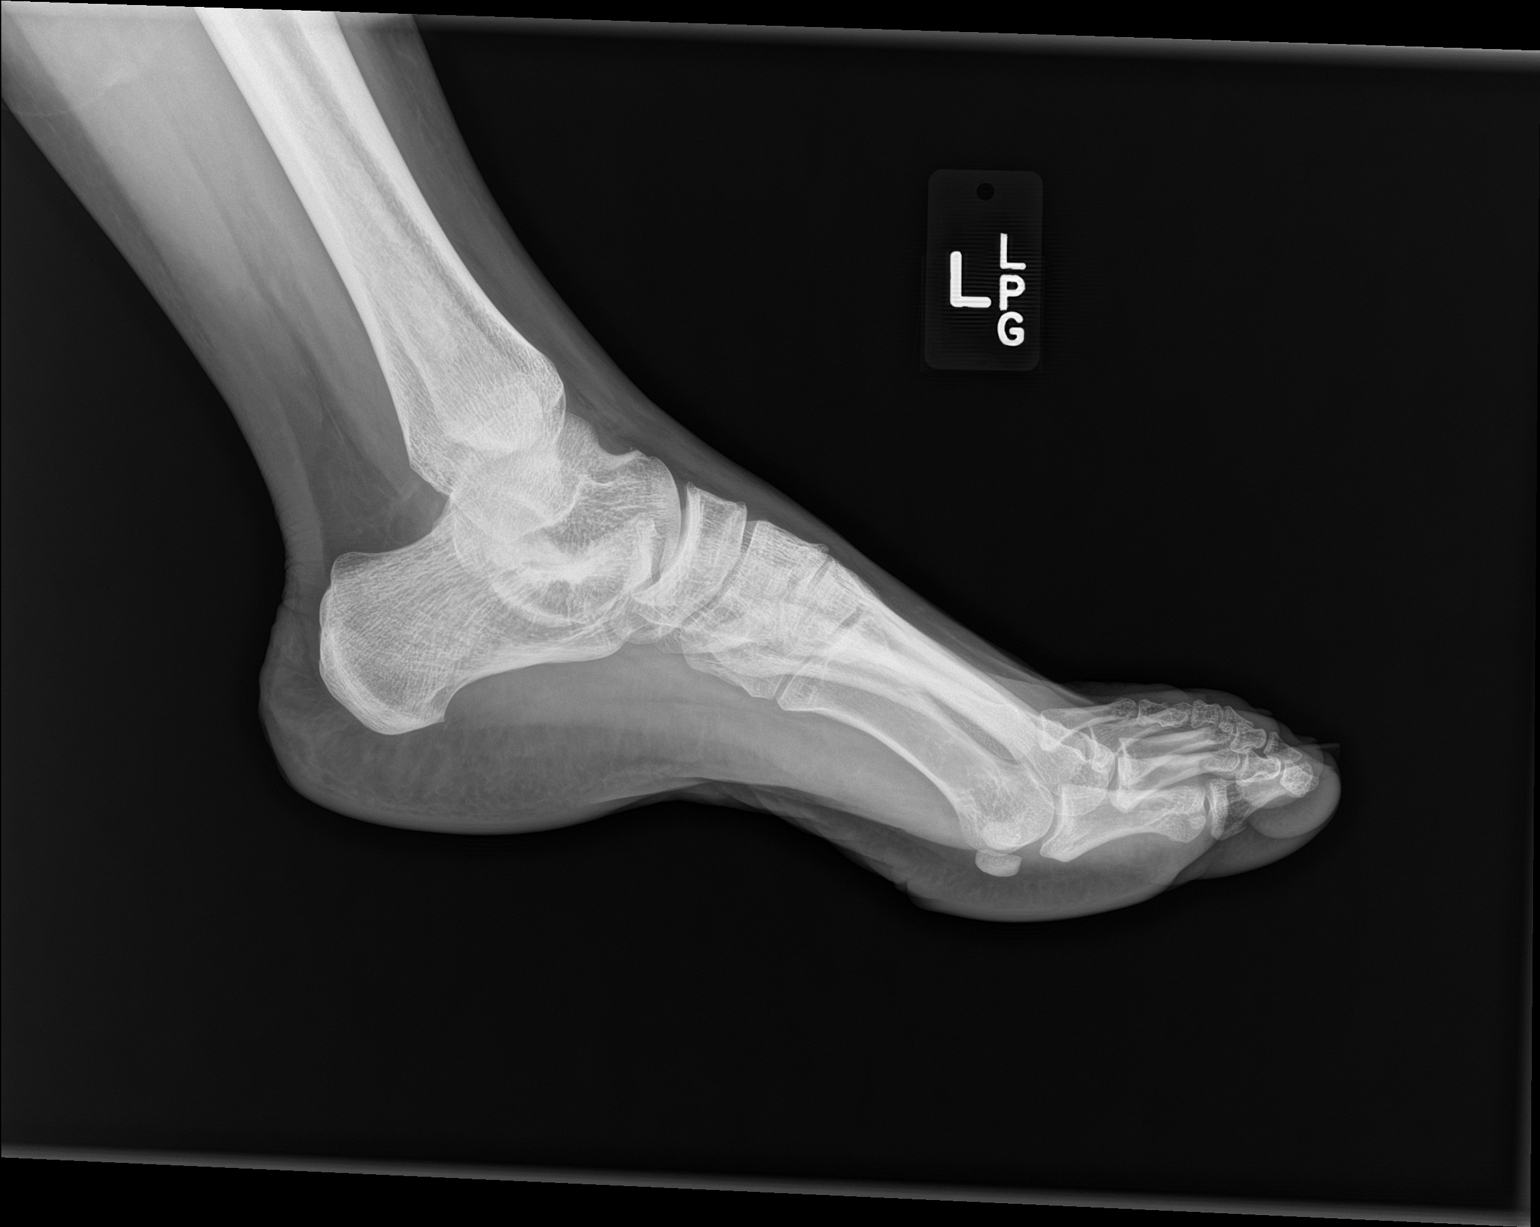

[3 of 3 positions shown; findings below may reference images not displayed]

FINDINGS: Osseous mineralization normal.

Joint spaces preserved.

No fracture, dislocation, or bone destruction.
IMPRESSION: Normal exam, unchanged.

## 2023-01-31 DIAGNOSIS — Z0001 Encounter for general adult medical examination with abnormal findings: Secondary | ICD-10-CM | POA: Diagnosis not present

## 2023-01-31 DIAGNOSIS — E559 Vitamin D deficiency, unspecified: Secondary | ICD-10-CM | POA: Diagnosis not present

## 2023-01-31 DIAGNOSIS — K219 Gastro-esophageal reflux disease without esophagitis: Secondary | ICD-10-CM | POA: Diagnosis not present

## 2023-01-31 DIAGNOSIS — Z1329 Encounter for screening for other suspected endocrine disorder: Secondary | ICD-10-CM | POA: Diagnosis not present

## 2023-01-31 DIAGNOSIS — E7800 Pure hypercholesterolemia, unspecified: Secondary | ICD-10-CM | POA: Diagnosis not present

## 2023-01-31 DIAGNOSIS — E663 Overweight: Secondary | ICD-10-CM | POA: Diagnosis not present

## 2023-01-31 DIAGNOSIS — Z136 Encounter for screening for cardiovascular disorders: Secondary | ICD-10-CM | POA: Diagnosis not present

## 2023-01-31 DIAGNOSIS — Z131 Encounter for screening for diabetes mellitus: Secondary | ICD-10-CM | POA: Diagnosis not present

## 2023-02-21 DIAGNOSIS — Z6826 Body mass index (BMI) 26.0-26.9, adult: Secondary | ICD-10-CM | POA: Diagnosis not present

## 2023-02-21 DIAGNOSIS — E782 Mixed hyperlipidemia: Secondary | ICD-10-CM | POA: Diagnosis not present

## 2023-02-21 DIAGNOSIS — N289 Disorder of kidney and ureter, unspecified: Secondary | ICD-10-CM | POA: Diagnosis not present

## 2023-02-21 DIAGNOSIS — K219 Gastro-esophageal reflux disease without esophagitis: Secondary | ICD-10-CM | POA: Diagnosis not present

## 2023-02-21 DIAGNOSIS — R7303 Prediabetes: Secondary | ICD-10-CM | POA: Diagnosis not present

## 2023-02-21 DIAGNOSIS — E559 Vitamin D deficiency, unspecified: Secondary | ICD-10-CM | POA: Diagnosis not present

## 2023-03-02 DIAGNOSIS — E559 Vitamin D deficiency, unspecified: Secondary | ICD-10-CM | POA: Diagnosis not present

## 2023-03-02 DIAGNOSIS — Z833 Family history of diabetes mellitus: Secondary | ICD-10-CM | POA: Diagnosis not present

## 2023-03-02 DIAGNOSIS — E785 Hyperlipidemia, unspecified: Secondary | ICD-10-CM | POA: Diagnosis not present

## 2023-03-02 DIAGNOSIS — K219 Gastro-esophageal reflux disease without esophagitis: Secondary | ICD-10-CM | POA: Diagnosis not present

## 2023-03-02 DIAGNOSIS — N182 Chronic kidney disease, stage 2 (mild): Secondary | ICD-10-CM | POA: Diagnosis not present

## 2023-03-02 DIAGNOSIS — R030 Elevated blood-pressure reading, without diagnosis of hypertension: Secondary | ICD-10-CM | POA: Diagnosis not present

## 2023-03-02 DIAGNOSIS — Z8249 Family history of ischemic heart disease and other diseases of the circulatory system: Secondary | ICD-10-CM | POA: Diagnosis not present

## 2023-03-28 DIAGNOSIS — R42 Dizziness and giddiness: Secondary | ICD-10-CM | POA: Diagnosis not present

## 2023-03-28 DIAGNOSIS — Z20822 Contact with and (suspected) exposure to covid-19: Secondary | ICD-10-CM | POA: Diagnosis not present

## 2023-04-01 DIAGNOSIS — Z6825 Body mass index (BMI) 25.0-25.9, adult: Secondary | ICD-10-CM | POA: Diagnosis not present

## 2023-04-01 DIAGNOSIS — Z0184 Encounter for antibody response examination: Secondary | ICD-10-CM | POA: Diagnosis not present

## 2023-04-03 DIAGNOSIS — Z0184 Encounter for antibody response examination: Secondary | ICD-10-CM | POA: Diagnosis not present

## 2023-04-07 DIAGNOSIS — R7612 Nonspecific reaction to cell mediated immunity measurement of gamma interferon antigen response without active tuberculosis: Secondary | ICD-10-CM | POA: Diagnosis not present

## 2023-08-28 NOTE — Telephone Encounter (Signed)
Open in error

## 2024-02-19 DIAGNOSIS — Z8249 Family history of ischemic heart disease and other diseases of the circulatory system: Secondary | ICD-10-CM | POA: Diagnosis not present

## 2024-02-19 DIAGNOSIS — E785 Hyperlipidemia, unspecified: Secondary | ICD-10-CM | POA: Diagnosis not present

## 2024-02-19 DIAGNOSIS — Z886 Allergy status to analgesic agent status: Secondary | ICD-10-CM | POA: Diagnosis not present

## 2024-02-19 DIAGNOSIS — Z833 Family history of diabetes mellitus: Secondary | ICD-10-CM | POA: Diagnosis not present

## 2024-02-19 DIAGNOSIS — R030 Elevated blood-pressure reading, without diagnosis of hypertension: Secondary | ICD-10-CM | POA: Diagnosis not present

## 2024-02-19 DIAGNOSIS — K219 Gastro-esophageal reflux disease without esophagitis: Secondary | ICD-10-CM | POA: Diagnosis not present

## 2024-02-19 DIAGNOSIS — N189 Chronic kidney disease, unspecified: Secondary | ICD-10-CM | POA: Diagnosis not present

## 2024-02-19 DIAGNOSIS — R7303 Prediabetes: Secondary | ICD-10-CM | POA: Diagnosis not present

## 2024-02-20 DIAGNOSIS — E782 Mixed hyperlipidemia: Secondary | ICD-10-CM | POA: Diagnosis not present

## 2024-02-20 DIAGNOSIS — K219 Gastro-esophageal reflux disease without esophagitis: Secondary | ICD-10-CM | POA: Diagnosis not present

## 2024-02-20 DIAGNOSIS — R7303 Prediabetes: Secondary | ICD-10-CM | POA: Diagnosis not present

## 2024-02-20 DIAGNOSIS — Z6826 Body mass index (BMI) 26.0-26.9, adult: Secondary | ICD-10-CM | POA: Diagnosis not present

## 2024-02-20 DIAGNOSIS — Z131 Encounter for screening for diabetes mellitus: Secondary | ICD-10-CM | POA: Diagnosis not present

## 2024-02-20 DIAGNOSIS — Z0001 Encounter for general adult medical examination with abnormal findings: Secondary | ICD-10-CM | POA: Diagnosis not present

## 2024-02-20 DIAGNOSIS — E559 Vitamin D deficiency, unspecified: Secondary | ICD-10-CM | POA: Diagnosis not present

## 2024-03-05 DIAGNOSIS — R7303 Prediabetes: Secondary | ICD-10-CM | POA: Diagnosis not present

## 2024-03-05 DIAGNOSIS — K219 Gastro-esophageal reflux disease without esophagitis: Secondary | ICD-10-CM | POA: Diagnosis not present

## 2024-03-05 DIAGNOSIS — N926 Irregular menstruation, unspecified: Secondary | ICD-10-CM | POA: Diagnosis not present

## 2024-03-05 DIAGNOSIS — E559 Vitamin D deficiency, unspecified: Secondary | ICD-10-CM | POA: Diagnosis not present

## 2024-03-05 DIAGNOSIS — E782 Mixed hyperlipidemia: Secondary | ICD-10-CM | POA: Diagnosis not present

## 2024-03-05 DIAGNOSIS — Z6826 Body mass index (BMI) 26.0-26.9, adult: Secondary | ICD-10-CM | POA: Diagnosis not present

## 2024-03-08 ENCOUNTER — Ambulatory Visit (HOSPITAL_COMMUNITY)
Admission: RE | Admit: 2024-03-08 | Discharge: 2024-03-08 | Disposition: A | Discharge status: Home / Self Care | Source: Ambulatory Visit | Attending: Emergency Medicine | Admitting: Emergency Medicine

## 2024-03-08 ENCOUNTER — Ambulatory Visit (HOSPITAL_COMMUNITY): Payer: Self-pay

## 2024-03-08 ENCOUNTER — Encounter (HOSPITAL_COMMUNITY): Payer: Self-pay

## 2024-03-08 VITALS — BP 113/76 | HR 73 | Temp 98.1°F | Resp 16

## 2024-03-08 DIAGNOSIS — Z3202 Encounter for pregnancy test, result negative: Secondary | ICD-10-CM

## 2024-03-08 DIAGNOSIS — R109 Unspecified abdominal pain: Secondary | ICD-10-CM | POA: Diagnosis not present

## 2024-03-08 DIAGNOSIS — N939 Abnormal uterine and vaginal bleeding, unspecified: Secondary | ICD-10-CM | POA: Diagnosis not present

## 2024-03-08 LAB — CBC
HCT: 40.5 % (ref 36.0–46.0)
Hemoglobin: 13.6 g/dL (ref 12.0–15.0)
MCH: 30.2 pg (ref 26.0–34.0)
MCHC: 33.6 g/dL (ref 30.0–36.0)
MCV: 89.8 fL (ref 80.0–100.0)
Platelets: 212 10*3/uL (ref 150–400)
RBC: 4.51 MIL/uL (ref 3.87–5.11)
RDW: 13.5 % (ref 11.5–15.5)
WBC: 4 10*3/uL (ref 4.0–10.5)
nRBC: 0 % (ref 0.0–0.2)

## 2024-03-08 LAB — POCT URINE PREGNANCY: Preg Test, Ur: NEGATIVE

## 2024-03-08 MED ORDER — MEFENAMIC ACID 250 MG PO CAPS
250.0000 mg | ORAL_CAPSULE | Freq: Three times a day (TID) | ORAL | 3 refills | Status: AC | PRN
Start: 2024-03-08 — End: ?

## 2024-03-08 NOTE — ED Triage Notes (Signed)
 C/O vaginal bleeding x2 wks. States going through approx 5 pads per day. States occasionally passing clots -- experiences abd cramping when passing a clot, but otherwise no pain.

## 2024-03-08 NOTE — Discharge Instructions (Addendum)
  1. Abnormal uterine bleeding (AUB) (Primary) - POCT urine pregnancy completed in UC is negative. - CBC collected in UC to evaluate hemoglobin level due to abnormal uterine bleeding x 2 weeks - Mefenamic Acid 250 MG CAPS; Take 1 capsule (250 mg total) by mouth 3 (three) times daily as needed. Take 500 mg (2 capsules) for the first dose, followed by 250 mg for each additional dose up to 3 doses per day for primary dysmenorrhea. - Follow-up with gynecology as scheduled for evaluation of abnormal uterine bleeding and potential causes. -Continue to monitor symptoms for any change in severity if there is any escalation of current symptoms or development of new symptoms follow-up in ER for further evaluation and management.

## 2024-03-08 NOTE — ED Provider Notes (Signed)
 [facility]  Note:  This document was prepared using Dragon voice recognition software and may include unintentional dictation errors.  MRN: [id number] DOB: 06-11-1986  Subjective:   Victoria Keith is a 38 y.o. female presenting for abnormal vaginal bleeding x 2 weeks.  Patient reports that she goes through approximately 5 pads per day and is passing clots.  Patient has abdominal cramping intermittently otherwise no pain.  Patient has been taking Tylenol  with minimal improvement.  Patient reached out to her gynecologist to see did not have any available appointments until June.  Patient made an appointment to see gynecologist but could not wait until appointment to be evaluated.  Patient denies any use of oral or injected contraceptive.  Patient has no prior history of abnormal uterine bleeding or fibroids.  No history of endometriosis.  Patient denies any dysuria, increased urinary frequency, abdominal pain, flank pain.  No current facility-administered medications for this encounter.  Current Outpatient Medications:    Mefenamic Acid 250 MG CAPS, Take 1 capsule (250 mg total) by mouth 3 (three) times daily as needed. Take 500 mg (2 capsules) for the first dose, followed by 250 mg for each additional dose up to 3 doses per day for primary dysmenorrhea., Disp: 40 capsule, Rfl: 3   Multiple Vitamin (MULTIVITAMIN PO), Take by mouth., Disp: , Rfl:    Prenatal Vit-Fe Fumarate-FA (PRENATAL VITAMIN PO), Take 1 tablet by mouth daily. , Disp: , Rfl:    Allergies  Allergen Reactions   Diclofenac     Stomach ulcers     Past Medical History:  Diagnosis Date   Complication of anesthesia    says she had a seizure from a double dose of the anesthesia     Past Surgical History:  Procedure Laterality Date   CESAREAN SECTION     CESAREAN SECTION N/A 08/09/2016   Procedure: REPEAT CESAREAN SECTION;  Surgeon: Raynell Caller, MD;  Location: [facility];  Service: Obstetrics;   Laterality: N/A;    History reviewed. No pertinent family history.  Social History   Tobacco Use   Smoking status: Never   Smokeless tobacco: Never  Vaping Use   Vaping status: Never Used  Substance Use Topics   Alcohol use: No   Drug use: No    ROS Refer to HPI for ROS details.  Objective:   Vitals: BP 113/76   Pulse 73   Temp 98.1 F (36.7 C) (Oral)   Resp 16   LMP 02/21/2024 (Exact Date)   SpO2 99%   Physical Exam Vitals and nursing note reviewed.  Constitutional:      General: She is not in acute distress.    Appearance: Normal appearance. She is well-developed. She is not ill-appearing, toxic-appearing or diaphoretic.  HENT:     Head: Normocephalic and atraumatic.  Cardiovascular:     Rate and Rhythm: Normal rate.  Pulmonary:     Effort: Pulmonary effort is normal. No respiratory distress.  Abdominal:     General: There is no distension.     Palpations: Abdomen is soft.     Tenderness: There is no abdominal tenderness. There is no right CVA tenderness, left CVA tenderness, guarding or rebound.  Genitourinary:    Vagina: Bleeding present. No vaginal discharge.  Skin:    General: Skin is warm and dry.     Coloration: Skin is not pale.  Neurological:     General: No focal deficit present.     Mental Status: She is alert and oriented  to person, place, and time.  Psychiatric:        Mood and Affect: Mood normal.        Behavior: Behavior normal.     Procedures  Results for orders placed or performed during the hospital encounter of 03/08/24 (from the past 24 hours)  POCT urine pregnancy     Status: Normal   Collection Time: 03/08/24 11:20 AM  Result Value Ref Range   Preg Test, Ur Negative     No results found.   Assessment and Plan :     Discharge Instructions       1. Abnormal uterine bleeding (AUB) (Primary) - POCT urine pregnancy completed in UC is negative. - CBC collected in UC to evaluate hemoglobin level due to abnormal uterine  bleeding x 2 weeks - Mefenamic Acid 250 MG CAPS; Take 1 capsule (250 mg total) by mouth 3 (three) times daily as needed. Take 500 mg (2 capsules) for the first dose, followed by 250 mg for each additional dose up to 3 doses per day for primary dysmenorrhea. - Follow-up with gynecology as scheduled for evaluation of abnormal uterine bleeding and potential causes. -Continue to monitor symptoms for any change in severity if there is any escalation of current symptoms or development of new symptoms follow-up in ER for further evaluation and management.    Victoria Keith B Victoria Keith   Victoria Keith B, [place] 03/08/24 1156

## 2024-03-21 DIAGNOSIS — R102 Pelvic and perineal pain: Secondary | ICD-10-CM | POA: Diagnosis not present

## 2024-03-21 DIAGNOSIS — N938 Other specified abnormal uterine and vaginal bleeding: Secondary | ICD-10-CM | POA: Diagnosis not present

## 2024-03-21 DIAGNOSIS — N809 Endometriosis, unspecified: Secondary | ICD-10-CM | POA: Diagnosis not present

## 2024-04-18 DIAGNOSIS — Z01411 Encounter for gynecological examination (general) (routine) with abnormal findings: Secondary | ICD-10-CM | POA: Diagnosis not present

## 2024-04-18 DIAGNOSIS — Z1331 Encounter for screening for depression: Secondary | ICD-10-CM | POA: Diagnosis not present

## 2024-04-18 DIAGNOSIS — Z01419 Encounter for gynecological examination (general) (routine) without abnormal findings: Secondary | ICD-10-CM | POA: Diagnosis not present

## 2024-04-18 DIAGNOSIS — Z124 Encounter for screening for malignant neoplasm of cervix: Secondary | ICD-10-CM | POA: Diagnosis not present

## 2024-04-18 DIAGNOSIS — Z113 Encounter for screening for infections with a predominantly sexual mode of transmission: Secondary | ICD-10-CM | POA: Diagnosis not present
